# Patient Record
Sex: Male | Born: 1975 | Race: White | Hispanic: No | Marital: Single
Health system: Midwestern US, Academic
[De-identification: ages and names within clinical notes are randomized; demographics above are authoritative.]

## PROBLEM LIST (undated history)

## (undated) DIAGNOSIS — E785 Hyperlipidemia, unspecified: Secondary | ICD-10-CM

## (undated) HISTORY — PX: WISDOM TOOTH EXTRACTION: SHX21

## (undated) HISTORY — DX: Hyperlipidemia, unspecified: E78.5

---

## 2012-05-19 ENCOUNTER — Encounter

## 2012-05-30 ENCOUNTER — Ambulatory Visit

## 2012-06-06 ENCOUNTER — Ambulatory Visit: Admit: 2012-06-06 | Discharge: 2012-06-06 | Payer: PRIVATE HEALTH INSURANCE

## 2012-06-06 ENCOUNTER — Inpatient Hospital Stay: Admit: 2012-06-06 | Payer: PRIVATE HEALTH INSURANCE

## 2012-06-06 DIAGNOSIS — M542 Cervicalgia: Secondary | ICD-10-CM

## 2012-06-06 NOTE — Unmapped (Signed)
App 55mo neck pain/ R trapz pain.  Seen by PMD and got 12d steroids, helped, but then recurred.  Then got steroid shot but didn't help and started w L sided ant lat neck pain  No pain into arms.  Has some tingling in B hands ulnar sided, rare x 40mo.      PE  Gen: No acute distress  HEENT: NCAT  Neck: supple  Chest: symm expansion  Abd: Nondistended  UE: FROM shoulders, elbows, wrists wo pain  LE: FROM hips, knees, ankles wo pain  Spine:  FROM w mild pain at extremes  Sl dominant R trapz  No focal SM deficit    Plan  PT for neck/ trapz strengthening/ strecthing  I had an extensive discussion with the patient regarding the multifactorial nature of axial spinal pain with and without radicular symptoms.  We discussed various treatment options, including nonoperative strategies such as judicious medication use, physical therapy, home exercise programs, and interventional pain management.  Answered pt questions to satisfaction.

## 2013-06-08 ENCOUNTER — Ambulatory Visit: Admit: 2013-06-08 | Discharge: 2013-06-08 | Payer: PRIVATE HEALTH INSURANCE

## 2013-06-08 DIAGNOSIS — M542 Cervicalgia: Secondary | ICD-10-CM

## 2013-06-08 NOTE — Unmapped (Signed)
Has continued neck pain esp in R trapz  Associated w neck ROM  Did PT x 2 sessions, helped last year but then sx recurred in June.  Started HEP but didn't help  Saw PMD, showed RC tear, referred here for further eval    PE  Neg impingement  Pos Spurlings  CO numbness in arm   Mild effort dependent gen weakness esp prox    Imaging revd    Plan  MRI c spine  Fu after mRI

## 2013-06-16 NOTE — Unmapped (Signed)
MRI Cervical Spine WO has been approved through [employer]. Auth# [id number] expires 07/15/13. I called the patient to schedule and per his request, I gave him the number for central scheduling so that he can set up an appropriate time for the scan. -Lauren

## 2013-06-25 ENCOUNTER — Inpatient Hospital Stay: Admit: 2013-06-25 | Payer: PRIVATE HEALTH INSURANCE

## 2013-06-25 DIAGNOSIS — M542 Cervicalgia: Secondary | ICD-10-CM

## 2013-07-06 ENCOUNTER — Ambulatory Visit: Admit: 2013-07-06 | Discharge: 2013-07-06 | Payer: PRIVATE HEALTH INSURANCE

## 2013-07-06 DIAGNOSIS — M542 Cervicalgia: Secondary | ICD-10-CM

## 2013-07-06 NOTE — Unmapped (Signed)
Co R trapz area pain  No radiation down arm  No focal weakness other than rel to activity sec pain    Exam w FORM c spine w mild pain w rot.  No focal SM deficit BUE  Neg winging    MRI c spine nl    Plan  Discussed poss etiologies for sx  Start PT, try TENS, local modalities

## 2013-07-06 NOTE — Unmapped (Signed)
Addended by: Marchelle Gearing L on: 07/06/2013 01:06 PM     Modules accepted: Orders

## 2013-07-06 NOTE — Unmapped (Signed)
Addended by: Raynelle Fanning on: 07/06/2013 01:51 PM     Modules accepted: Orders

## 2013-10-22 ENCOUNTER — Inpatient Hospital Stay: Admit: 2013-10-22 | Payer: PRIVATE HEALTH INSURANCE

## 2013-10-22 ENCOUNTER — Ambulatory Visit: Admit: 2013-10-22 | Discharge: 2013-10-22 | Payer: PRIVATE HEALTH INSURANCE

## 2013-10-22 DIAGNOSIS — M25519 Pain in unspecified shoulder: Secondary | ICD-10-CM

## 2013-10-22 DIAGNOSIS — S40019A Contusion of unspecified shoulder, initial encounter: Secondary | ICD-10-CM
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
## 2013-10-22 NOTE — Unmapped (Signed)
This office note has been dictated.

## 2015-06-08 ENCOUNTER — Encounter: Payer: Self-pay | Admitting: Family Medicine

## 2015-06-08 ENCOUNTER — Ambulatory Visit (INDEPENDENT_AMBULATORY_CARE_PROVIDER_SITE_OTHER): Payer: 59 | Admitting: Family Medicine

## 2015-06-08 VITALS — BP 105/70 | HR 67 | Temp 97.9°F | Ht 70.1 in | Wt 166.0 lb

## 2015-06-08 DIAGNOSIS — Z23 Encounter for immunization: Secondary | ICD-10-CM | POA: Diagnosis not present

## 2015-06-08 DIAGNOSIS — D690 Allergic purpura: Secondary | ICD-10-CM | POA: Diagnosis not present

## 2015-06-08 NOTE — Progress Notes (Signed)
   BP 105/70 mmHg  Pulse 67  Temp(Src) 97.9 F (36.6 C)  Ht 5' 10.1" (1.781 m)  Wt 166 lb (75.297 kg)  BMI 23.74 kg/m2  SpO2 97%   Subjective:    Patient ID: Brian Cox, male    DOB: 12-Jun-1976, 39 y.o.   MRN: [id number]  HPI: Brian Cox is a 39 y.o. male  Chief Complaint  Patient presents with  . 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  Relevant past medical, surgical, family and social history reviewed and updated as indicated. Interim medical history since our last visit reviewed. Allergies and medications reviewed and updated.  Review of Systems  Constitutional: Negative.   HENT: Negative.   Respiratory: Negative.   Cardiovascular: Negative.     Per HPI unless specifically indicated above     Objective:    BP 105/70 mmHg  Pulse 67  Temp(Src) 97.9 F (36.6 C)  Ht 5' 10.1" (1.781 m)  Wt 166 lb (75.297 kg)  BMI 23.74 kg/m2  SpO2 97%  Wt Readings from Last 3 Encounters:  06/08/15 166 lb (75.297 kg)    Physical Exam  Constitutional: He is oriented to person, place, and time. He appears well-developed and well-nourished. No distress.  HENT:  Head: Normocephalic and atraumatic.  Right Ear: Hearing normal.  Left Ear: Hearing normal.  Nose: Nose normal.  Eyes: Conjunctivae and lids are normal. Right eye exhibits no discharge. Left eye exhibits no discharge. No scleral icterus.  Cardiovascular: Normal rate, regular rhythm and normal heart sounds.   Pulmonary/Chest: Effort normal and breath sounds normal. No respiratory distress.  Musculoskeletal: Normal range of motion.  Neurological: He is alert and oriented to person, place, and time.  Skin: Skin is intact. No  rash noted.  Psychiatric: He has a normal mood and affect. His speech is normal and behavior is normal. Judgment and thought content normal. Cognition and memory are normal.    No results found for this or any previous visit.    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

## 2016-03-29 ENCOUNTER — Ambulatory Visit (INDEPENDENT_AMBULATORY_CARE_PROVIDER_SITE_OTHER): Payer: 59 | Admitting: Family Medicine

## 2016-03-29 ENCOUNTER — Encounter: Payer: Self-pay | Admitting: Family Medicine

## 2016-03-29 VITALS — BP 114/72 | HR 79 | Temp 97.8°F | Ht 71.0 in | Wt 165.0 lb

## 2016-03-29 DIAGNOSIS — Z0000 Encounter for general adult medical examination without abnormal findings: Secondary | ICD-10-CM | POA: Diagnosis not present

## 2016-03-29 LAB — URINALYSIS, ROUTINE W REFLEX MICROSCOPIC
BILIRUBIN UA: NEGATIVE
GLUCOSE, UA: NEGATIVE
KETONES UA: NEGATIVE
Leukocytes, UA: NEGATIVE
NITRITE UA: NEGATIVE
Protein, UA: NEGATIVE
SPEC GRAV UA: 1.02 (ref 1.005–1.030)
Urobilinogen, Ur: 0.2 mg/dL (ref 0.2–1.0)
pH, UA: 6 (ref 5.0–7.5)

## 2016-03-29 LAB — MICROSCOPIC EXAMINATION

## 2016-03-29 NOTE — Progress Notes (Signed)
   BP 114/72 (BP Location: Left Arm, Patient Position: Sitting, Cuff Size: Normal)   Pulse 79   Temp 97.8 F (36.6 C)   Ht 5\' 11"  (1.803 m)   Wt 165 lb (74.8 kg)   SpO2 99%   BMI 23.01 kg/m    Subjective:    Patient ID: Brian Cox, male    DOB: 1976/07/16, 40 y.o.   MRN: [id number]  HPI: Brian Cox is a 40 y.o. male  Annual exam Has hx of low vit D takes 5000u a day   Relevant past medical, surgical, family and social history reviewed and updated as indicated. Interim medical history since our last visit reviewed. Allergies and medications reviewed and updated.  Review of Systems  Constitutional: Negative.   HENT: Negative.   Eyes: Negative.   Respiratory: Negative.   Cardiovascular: Negative.   Gastrointestinal: Negative.   Endocrine: Negative.   Genitourinary: Negative.   Musculoskeletal: Negative.   Skin: Negative.   Allergic/Immunologic: Negative.   Neurological: Negative.   Hematological: Negative.   Psychiatric/Behavioral: Negative.     Per HPI unless specifically indicated above     Objective:    BP 114/72 (BP Location: Left Arm, Patient Position: Sitting, Cuff Size: Normal)   Pulse 79   Temp 97.8 F (36.6 C)   Ht 5\' 11"  (1.803 m)   Wt 165 lb (74.8 kg)   SpO2 99%   BMI 23.01 kg/m   Wt Readings from Last 3 Encounters:  03/29/16 165 lb (74.8 kg)  06/08/15 166 lb (75.3 kg)    Physical Exam  Constitutional: He is oriented to person, place, and time. He appears well-developed and well-nourished.  HENT:  Head: Normocephalic.  Right Ear: External ear normal.  Left Ear: External ear normal.  Nose: Nose normal.  Eyes: Conjunctivae and EOM are normal. Pupils are equal, round, and reactive to light.  Neck: Normal range of motion. Neck supple. No thyromegaly present.  Cardiovascular: Normal rate, regular rhythm, normal heart sounds and intact distal pulses.   Pulmonary/Chest: Effort normal and breath sounds normal.  Abdominal: Soft. Bowel  sounds are normal. There is no splenomegaly or hepatomegaly.  Genitourinary: Penis normal.  Musculoskeletal: Normal range of motion.  Lymphadenopathy:    He has no cervical adenopathy.  Neurological: He is alert and oriented to person, place, and time. He has normal reflexes.  Skin: Skin is warm and dry.  Psychiatric: He has a normal mood and affect. His behavior is normal. Judgment and thought content normal.    No results found for this or any previous visit.    Assessment & Plan:   Problem List Items Addressed This Visit    None    Visit Diagnoses    Annual physical exam    -  Primary   Relevant Orders   Urinalysis, Routine w reflex microscopic (not at [facility])   CBC with Differential/Platelet   Comprehensive metabolic panel   Lipid Panel w/o Chol/HDL Ratio   TSH   VITAMIN D 25 Hydroxy (Vit-D Deficiency, Fractures)       Follow up plan: Return in about 1 year (around 03/29/2017) for Physical Exam.

## 2016-03-30 LAB — CBC WITH DIFFERENTIAL/PLATELET
Basophils Absolute: 0.1 10*3/uL (ref 0.0–0.2)
Basos: 1 %
EOS (ABSOLUTE): 0.3 10*3/uL (ref 0.0–0.4)
EOS: 4 %
HEMATOCRIT: 47.4 % (ref 37.5–51.0)
HEMOGLOBIN: 16.3 g/dL (ref 12.6–17.7)
IMMATURE GRANS (ABS): 0 10*3/uL (ref 0.0–0.1)
IMMATURE GRANULOCYTES: 0 %
LYMPHS ABS: 1.7 10*3/uL (ref 0.7–3.1)
Lymphs: 27 %
MCH: 29.6 pg (ref 26.6–33.0)
MCHC: 34.4 g/dL (ref 31.5–35.7)
MCV: 86 fL (ref 79–97)
MONOCYTES: 11 %
Monocytes Absolute: 0.7 10*3/uL (ref 0.1–0.9)
NEUTROS PCT: 57 %
Neutrophils Absolute: 3.6 10*3/uL (ref 1.4–7.0)
Platelets: 226 10*3/uL (ref 150–379)
RBC: 5.51 x10E6/uL (ref 4.14–5.80)
RDW: 12.8 % (ref 12.3–15.4)
WBC: 6.3 10*3/uL (ref 3.4–10.8)

## 2016-03-30 LAB — TSH: TSH: 2.25 u[IU]/mL (ref 0.450–4.500)

## 2016-03-30 LAB — COMPREHENSIVE METABOLIC PANEL
ALBUMIN: 4.8 g/dL (ref 3.5–5.5)
ALT: 20 IU/L (ref 0–44)
AST: 19 IU/L (ref 0–40)
Albumin/Globulin Ratio: 1.7 (ref 1.2–2.2)
Alkaline Phosphatase: 66 IU/L (ref 39–117)
BUN / CREAT RATIO: 10 (ref 9–20)
BUN: 10 mg/dL (ref 6–20)
Bilirubin Total: 0.4 mg/dL (ref 0.0–1.2)
CALCIUM: 9.2 mg/dL (ref 8.7–10.2)
CO2: 23 mmol/L (ref 18–29)
CREATININE: 0.99 mg/dL (ref 0.76–1.27)
Chloride: 100 mmol/L (ref 96–106)
GFR calc Af Amer: 110 mL/min/{1.73_m2} (ref >59)
GFR, EST NON AFRICAN AMERICAN: 96 mL/min/{1.73_m2} (ref >59)
GLOBULIN, TOTAL: 2.8 g/dL (ref 1.5–4.5)
Glucose: 84 mg/dL (ref 65–99)
Potassium: 4 mmol/L (ref 3.5–5.2)
SODIUM: 139 mmol/L (ref 134–144)
TOTAL PROTEIN: 7.6 g/dL (ref 6.0–8.5)

## 2016-03-30 LAB — LIPID PANEL W/O CHOL/HDL RATIO
Cholesterol, Total: 163 mg/dL (ref 100–199)
HDL: 37 mg/dL — ABNORMAL LOW (ref >39)
LDL CALC: 112 mg/dL — AB (ref 0–99)
Triglycerides: 68 mg/dL (ref 0–149)
VLDL CHOLESTEROL CAL: 14 mg/dL (ref 5–40)

## 2016-03-30 LAB — VITAMIN D 25 HYDROXY (VIT D DEFICIENCY, FRACTURES): VIT D 25 HYDROXY: 50 ng/mL (ref 30.0–100.0)

## 2016-04-02 ENCOUNTER — Encounter: Payer: Self-pay | Admitting: Family Medicine

## 2016-09-01 ENCOUNTER — Emergency Department
Admission: EM | Admit: 2016-09-01 | Discharge: 2016-09-01 | Disposition: A | Discharge status: Home / Self Care | Payer: 59 | Attending: Emergency Medicine | Admitting: Emergency Medicine

## 2016-09-01 DIAGNOSIS — K529 Noninfective gastroenteritis and colitis, unspecified: Secondary | ICD-10-CM | POA: Diagnosis not present

## 2016-09-01 DIAGNOSIS — R197 Diarrhea, unspecified: Secondary | ICD-10-CM | POA: Diagnosis present

## 2016-09-01 LAB — COMPREHENSIVE METABOLIC PANEL
ALBUMIN: 4.5 g/dL (ref 3.5–5.0)
ALT: 21 U/L (ref 17–63)
AST: 27 U/L (ref 15–41)
Alkaline Phosphatase: 50 U/L (ref 38–126)
Anion gap: 8 (ref 5–15)
BUN: 17 mg/dL (ref 6–20)
CHLORIDE: 104 mmol/L (ref 101–111)
CO2: 25 mmol/L (ref 22–32)
Calcium: 8.6 mg/dL — ABNORMAL LOW (ref 8.9–10.3)
Creatinine, Ser: 1.05 mg/dL (ref 0.61–1.24)
GFR calc Af Amer: >60 mL/min (ref >60)
GFR calc non Af Amer: >60 mL/min (ref >60)
GLUCOSE: 143 mg/dL — AB (ref 65–99)
POTASSIUM: 3.7 mmol/L (ref 3.5–5.1)
Sodium: 137 mmol/L (ref 135–145)
Total Bilirubin: 0.7 mg/dL (ref 0.3–1.2)
Total Protein: 7.7 g/dL (ref 6.5–8.1)

## 2016-09-01 LAB — CBC
HEMATOCRIT: 49.1 % (ref 40.0–52.0)
Hemoglobin: 17 g/dL (ref 13.0–18.0)
MCH: 29.7 pg (ref 26.0–34.0)
MCHC: 34.7 g/dL (ref 32.0–36.0)
MCV: 85.6 fL (ref 80.0–100.0)
Platelets: 197 10*3/uL (ref 150–440)
RBC: 5.73 MIL/uL (ref 4.40–5.90)
RDW: 13.8 % (ref 11.5–14.5)
WBC: 16.3 10*3/uL — ABNORMAL HIGH (ref 3.8–10.6)

## 2016-09-01 LAB — LIPASE, BLOOD: LIPASE: 20 U/L (ref 11–51)

## 2016-09-01 LAB — URINALYSIS, COMPLETE (UACMP) WITH MICROSCOPIC
BACTERIA UA: NONE SEEN
BILIRUBIN URINE: NEGATIVE
Glucose, UA: NEGATIVE mg/dL
Ketones, ur: 20 mg/dL — AB
LEUKOCYTES UA: NEGATIVE
Nitrite: NEGATIVE
Protein, ur: 30 mg/dL — AB
SPECIFIC GRAVITY, URINE: 1.033 — AB (ref 1.005–1.030)
pH: 5 (ref 5.0–8.0)

## 2016-09-01 MED ORDER — ONDANSETRON 4 MG PO TBDP: 4.0000 mg | ORAL_TABLET | Freq: Three times a day (TID) | ORAL | 0 refills | Status: DC | PRN

## 2016-09-01 MED ORDER — DIPHENHYDRAMINE HCL 25 MG PO CAPS
25.0000 mg | ORAL_CAPSULE | Freq: Once | ORAL | Status: AC
  Administered 2016-09-01: 25 mg via ORAL
  Filled 2016-09-01: qty 1

## 2016-09-01 MED ORDER — ONDANSETRON 4 MG PO TBDP
4.0000 mg | ORAL_TABLET | Freq: Once | ORAL | Status: AC
  Administered 2016-09-01: 4 mg via ORAL
  Filled 2016-09-01: qty 1

## 2016-09-01 MED ORDER — ONDANSETRON 4 MG PO TBDP
4.0000 mg | ORAL_TABLET | Freq: Once | ORAL | Status: AC | PRN
  Administered 2016-09-01: 4 mg via ORAL
  Filled 2016-09-01: qty 1

## 2016-09-01 MED ORDER — METOCLOPRAMIDE HCL 10 MG PO TABS
10.0000 mg | ORAL_TABLET | Freq: Once | ORAL | Status: AC
  Administered 2016-09-01: 10 mg via ORAL
  Filled 2016-09-01: qty 1

## 2016-09-01 NOTE — ED Notes (Signed)
Pt is in good condition; discharge instructions reviewed, follow up care and home care reviewed; prescription medication reviewed; pt verbalized understanding; pt is ambulatory and went home by himself

## 2016-09-01 NOTE — ED Provider Notes (Signed)
[facility] Emergency Department Provider Note        Time seen: ----------------------------------------- 8:04 PM on 09/01/2016 -----------------------------------------    I have reviewed the triage vital signs and the nursing notes.   HISTORY  Chief Complaint Emesis and Diarrhea    HPI Brian Cox is a 41 y.o. male who presents to the ER for nausea, vomiting and diarrhea with abdominal cramping that started yesterday.Patient states he received Zofran in triage here and he's been to drink ice after that. He denies fevers, chills, chest pain, shortness of breath, cough or other complaints at this time. He described cramping in his lower abdomen.   History reviewed. No pertinent past medical history.  There are no active problems to display for this patient.   History reviewed. No pertinent surgical history.  Allergies Ciprofloxacin hcl; Donnatal  [pb-hyoscy-atropine-scopol er]; and Levofloxacin  Social History Social History  Substance Use Topics  . Smoking status: Never Smoker  . Smokeless tobacco: Never Used  . Alcohol use No    Review of Systems Constitutional: Negative for fever. Cardiovascular: Negative for chest pain. Respiratory: Negative for shortness of breath. Gastrointestinal: Positive for abdominal pain, vomiting and diarrhea Genitourinary: Negative for dysuria. Musculoskeletal: Negative for back pain. Skin: Negative for rash. Neurological: Negative for headaches, focal weakness or numbness.  10-point ROS otherwise negative.  ____________________________________________   PHYSICAL EXAM:  VITAL SIGNS: ED Triage Vitals [09/01/16 1703]  Enc Vitals Group     BP 106/71     Pulse Rate (!) 103     Resp 18     Temp 98.1 F (36.7 C)     Temp Source Oral     SpO2 100 %     Weight 165 lb (74.8 kg)     Height 5\' 11"  (1.803 m)     Head Circumference      Peak Flow      Pain Score 6     Pain Loc      Pain Edu?     Excl. in [place]?     Constitutional: Alert and oriented. Well appearing and in no distress. Eyes: Conjunctivae are normal. PERRL. Normal extraocular movements. ENT   Head: Normocephalic and atraumatic.   Nose: No congestion/rhinnorhea.   Mouth/Throat: Mucous membranes are moist.   Neck: No stridor. Cardiovascular: Normal rate, regular rhythm. No murmurs, rubs, or gallops. Respiratory: Normal respiratory effort without tachypnea nor retractions. Breath sounds are clear and equal bilaterally. No wheezes/rales/rhonchi. Gastrointestinal: Soft and nontender. Normal bowel sounds Musculoskeletal: Nontender with normal range of motion in all extremities. No lower extremity tenderness nor edema. Neurologic:  Normal speech and language. No gross focal neurologic deficits are appreciated.  Skin:  Skin is warm, dry and intact. No rash noted. Psychiatric: Mood and affect are normal. Speech and behavior are normal.  ____________________________________________  ED COURSE:  Pertinent labs & imaging results that were available during my care of the patient were reviewed by me and considered in my medical decision making (see chart for details). Clinical Course   Patient is no distress, we will assess with labs and possible imaging.  Procedures ____________________________________________   LABS (pertinent positives/negatives)  Labs Reviewed  COMPREHENSIVE METABOLIC PANEL - Abnormal; Notable for the following:       Result Value   Glucose, Bld 143 (*)    Calcium 8.6 (*)    All other components within normal limits  CBC - Abnormal; Notable for the following:    WBC 16.3 (*)  All other components within normal limits  LIPASE, BLOOD  URINALYSIS, COMPLETE (UACMP) WITH MICROSCOPIC   ____________________________________________  FINAL ASSESSMENT AND PLAN  Gastroenteritis  Plan: Patient with labs as dictated above. Patient's no distress, currently his symptoms have improved.  Leukocytosis is likely reactive to this viral illness. He has no focal abdominal tenderness. He will be discharge with Zofran. He is stable for outpatient follow-up.   Earleen Newport, MD   Note: This dictation was prepared with Dragon dictation. Any transcriptional errors that result from this process are unintentional    Earleen Newport, MD 09/01/16 2012

## 2016-09-01 NOTE — ED Notes (Signed)
Pt back up to the desk asking for nausea medication at this time. Given ordered zofran that pt had refused prior.

## 2016-09-01 NOTE — ED Triage Notes (Signed)
Pt arrives to ER via POV c/o NVD and abdominal cramping that started yesterday. Pt alert and oriented X4, active, cooperative, pt in NAD. RR even and unlabored, color WNL.

## 2016-11-30 DIAGNOSIS — H1013 Acute atopic conjunctivitis, bilateral: Secondary | ICD-10-CM | POA: Diagnosis not present

## 2017-03-23 DIAGNOSIS — J988 Other specified respiratory disorders: Secondary | ICD-10-CM | POA: Diagnosis not present

## 2017-03-25 ENCOUNTER — Encounter: Payer: Self-pay | Admitting: Family Medicine

## 2017-03-25 ENCOUNTER — Ambulatory Visit (INDEPENDENT_AMBULATORY_CARE_PROVIDER_SITE_OTHER): Payer: 59 | Admitting: Family Medicine

## 2017-03-25 VITALS — BP 119/82 | HR 76 | Temp 98.5°F | Wt 167.0 lb

## 2017-03-25 DIAGNOSIS — J020 Streptococcal pharyngitis: Secondary | ICD-10-CM | POA: Diagnosis not present

## 2017-03-25 DIAGNOSIS — J029 Acute pharyngitis, unspecified: Secondary | ICD-10-CM | POA: Diagnosis not present

## 2017-03-25 MED ORDER — LIDOCAINE VISCOUS 2 % MT SOLN: 5.0000 mL | OROMUCOSAL | 0 refills | Status: DC | PRN

## 2017-03-25 MED ORDER — AMOXICILLIN-POT CLAVULANATE 875-125 MG PO TABS: 1.0000 | ORAL_TABLET | Freq: Two times a day (BID) | ORAL | 0 refills | Status: DC
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
## 2017-03-25 NOTE — Patient Instructions (Signed)
Follow up if no improvement 

## 2017-03-26 LAB — RAPID STREP SCREEN (MED CTR MEBANE ONLY): STREP GP A AG, IA W/REFLEX: POSITIVE — AB

## 2017-04-01 ENCOUNTER — Ambulatory Visit (INDEPENDENT_AMBULATORY_CARE_PROVIDER_SITE_OTHER): Payer: 59 | Admitting: Family Medicine

## 2017-04-01 ENCOUNTER — Encounter: Payer: Self-pay | Admitting: Family Medicine

## 2017-04-01 VITALS — BP 127/77 | HR 51 | Ht 71.85 in | Wt 166.9 lb

## 2017-04-01 DIAGNOSIS — Z0000 Encounter for general adult medical examination without abnormal findings: Secondary | ICD-10-CM | POA: Diagnosis not present

## 2017-04-01 DIAGNOSIS — Z131 Encounter for screening for diabetes mellitus: Secondary | ICD-10-CM

## 2017-04-01 DIAGNOSIS — Z125 Encounter for screening for malignant neoplasm of prostate: Secondary | ICD-10-CM

## 2017-04-01 DIAGNOSIS — Z13220 Encounter for screening for lipoid disorders: Secondary | ICD-10-CM

## 2017-04-01 DIAGNOSIS — Z1329 Encounter for screening for other suspected endocrine disorder: Secondary | ICD-10-CM | POA: Diagnosis not present

## 2017-04-01 LAB — URINALYSIS, ROUTINE W REFLEX MICROSCOPIC
Bilirubin, UA: NEGATIVE
GLUCOSE, UA: NEGATIVE
KETONES UA: NEGATIVE
Leukocytes, UA: NEGATIVE
NITRITE UA: NEGATIVE
Protein, UA: NEGATIVE
Specific Gravity, UA: 1.02 (ref 1.005–1.030)
UUROB: 0.2 mg/dL (ref 0.2–1.0)
pH, UA: 6 (ref 5.0–7.5)

## 2017-04-01 LAB — MICROSCOPIC EXAMINATION
Bacteria, UA: NONE SEEN
Epithelial Cells (non renal): NONE SEEN /hpf (ref 0–10)
WBC UA: NONE SEEN /HPF (ref 0–?)

## 2017-04-01 NOTE — Progress Notes (Signed)
   BP 127/77   Pulse (!) 51   Ht 5' 11.85" (1.825 m)   Wt 166 lb 14.4 oz (75.7 kg)   SpO2 98%   BMI 22.73 kg/m    Subjective:    Patient ID: Brian Cox, male    DOB: September 16, 1975, 41 y.o.   MRN: [id number]  HPI: Brian Cox is a 41 y.o. male  Physical  Patient's sore throats feeling better A little bit but is on the mend. Otherwise doing well with no other concerns  Relevant past medical, surgical, family and social history reviewed and updated as indicated. Interim medical history since our last visit reviewed. Allergies and medications reviewed and updated.  Review of Systems  Constitutional: Negative.   HENT: Negative.   Eyes: Negative.   Respiratory: Negative.   Cardiovascular: Negative.   Gastrointestinal: Negative.   Endocrine: Negative.   Genitourinary: Negative.   Musculoskeletal: Negative.   Skin: Negative.   Allergic/Immunologic: Negative.   Neurological: Negative.   Hematological: Negative.   Psychiatric/Behavioral: Negative.     Per HPI unless specifically indicated above     Objective:    BP 127/77   Pulse (!) 51   Ht 5' 11.85" (1.825 m)   Wt 166 lb 14.4 oz (75.7 kg)   SpO2 98%   BMI 22.73 kg/m   Wt Readings from Last 3 Encounters:  04/01/17 166 lb 14.4 oz (75.7 kg)  03/25/17 167 lb (75.8 kg)  09/01/16 165 lb (74.8 kg)    Physical Exam  Constitutional: He is oriented to person, place, and time. He appears well-developed and well-nourished.  HENT:  Head: Normocephalic and atraumatic.  Right Ear: External ear normal.  Left Ear: External ear normal.  Eyes: Pupils are equal, round, and reactive to light. Conjunctivae and EOM are normal.  Neck: Normal range of motion. Neck supple.  Cardiovascular: Normal rate, regular rhythm, normal heart sounds and intact distal pulses.   Pulmonary/Chest: Effort normal and breath sounds normal.  Abdominal: Soft. Bowel sounds are normal. There is no splenomegaly or hepatomegaly.  Genitourinary:  Rectum normal, prostate normal and penis normal.  Musculoskeletal: Normal range of motion.  Neurological: He is alert and oriented to person, place, and time. He has normal reflexes.  Skin: No rash noted. No erythema.  Psychiatric: He has a normal mood and affect. His behavior is normal. Judgment and thought content normal.    Results for orders placed or performed in visit on 03/25/17  Rapid strep screen (not at [facility])  Result Value Ref Range   Strep Gp A Ag, IA W/Reflex Positive (A) Negative      Assessment & Plan:   Problem List Items Addressed This Visit    None    Visit Diagnoses    Screening for diabetes mellitus (DM)    -  Primary   Relevant Orders   Comprehensive metabolic panel   Urinalysis, Routine w reflex microscopic   Annual physical exam       Relevant Orders   CBC with Differential/Platelet   Screening cholesterol level       Relevant Orders   Lipid panel   Prostate cancer screening       Thyroid disorder screen       Relevant Orders   TSH       Follow up plan: Return in about 1 year (around 04/01/2018).

## 2017-04-02 ENCOUNTER — Encounter: Payer: Self-pay | Admitting: Family Medicine

## 2017-04-02 ENCOUNTER — Telehealth: Payer: Self-pay | Admitting: Family Medicine

## 2017-04-02 DIAGNOSIS — R748 Abnormal levels of other serum enzymes: Secondary | ICD-10-CM

## 2017-04-02 DIAGNOSIS — E7800 Pure hypercholesterolemia, unspecified: Secondary | ICD-10-CM

## 2017-04-02 LAB — COMPREHENSIVE METABOLIC PANEL
ALK PHOS: 82 IU/L (ref 39–117)
ALT: 50 IU/L — AB (ref 0–44)
AST: 30 IU/L (ref 0–40)
Albumin/Globulin Ratio: 1.3 (ref 1.2–2.2)
Albumin: 4.3 g/dL (ref 3.5–5.5)
BUN/Creatinine Ratio: 11 (ref 9–20)
BUN: 11 mg/dL (ref 6–24)
Bilirubin Total: 0.2 mg/dL (ref 0.0–1.2)
CO2: 24 mmol/L (ref 20–29)
Calcium: 9.3 mg/dL (ref 8.7–10.2)
Chloride: 104 mmol/L (ref 96–106)
Creatinine, Ser: 0.98 mg/dL (ref 0.76–1.27)
GFR calc Af Amer: 111 mL/min/{1.73_m2} (ref >59)
GFR calc non Af Amer: 96 mL/min/{1.73_m2} (ref >59)
GLUCOSE: 91 mg/dL (ref 65–99)
Globulin, Total: 3.2 g/dL (ref 1.5–4.5)
POTASSIUM: 5 mmol/L (ref 3.5–5.2)
Sodium: 139 mmol/L (ref 134–144)
TOTAL PROTEIN: 7.5 g/dL (ref 6.0–8.5)

## 2017-04-02 LAB — LIPID PANEL
CHOL/HDL RATIO: 5.2 ratio — AB (ref 0.0–5.0)
Cholesterol, Total: 177 mg/dL (ref 100–199)
HDL: 34 mg/dL — AB (ref >39)
LDL Calculated: 130 mg/dL — ABNORMAL HIGH (ref 0–99)
Triglycerides: 63 mg/dL (ref 0–149)
VLDL Cholesterol Cal: 13 mg/dL (ref 5–40)

## 2017-04-02 LAB — CBC WITH DIFFERENTIAL/PLATELET
BASOS ABS: 0.1 10*3/uL (ref 0.0–0.2)
Basos: 1 %
EOS (ABSOLUTE): 0.2 10*3/uL (ref 0.0–0.4)
Eos: 3 %
Hematocrit: 46.8 % (ref 37.5–51.0)
Hemoglobin: 16.1 g/dL (ref 13.0–17.7)
IMMATURE GRANS (ABS): 0.1 10*3/uL (ref 0.0–0.1)
Immature Granulocytes: 1 %
LYMPHS: 28 %
Lymphocytes Absolute: 1.8 10*3/uL (ref 0.7–3.1)
MCH: 29.5 pg (ref 26.6–33.0)
MCHC: 34.4 g/dL (ref 31.5–35.7)
MCV: 86 fL (ref 79–97)
Monocytes Absolute: 0.6 10*3/uL (ref 0.1–0.9)
Monocytes: 9 %
Neutrophils Absolute: 3.7 10*3/uL (ref 1.4–7.0)
Neutrophils: 58 %
PLATELETS: 295 10*3/uL (ref 150–379)
RBC: 5.46 x10E6/uL (ref 4.14–5.80)
RDW: 13.2 % (ref 12.3–15.4)
WBC: 6.4 10*3/uL (ref 3.4–10.8)

## 2017-04-02 LAB — TSH: TSH: 1.64 u[IU]/mL (ref 0.450–4.500)

## 2017-04-02 NOTE — Telephone Encounter (Signed)
Phone call Discussed with patient slight elevation in liver and cholesterol patient just got off antibiotics will recheck lipid panel ALT AST 1 month or so lab only

## 2017-06-06 DIAGNOSIS — Z23 Encounter for immunization: Secondary | ICD-10-CM | POA: Diagnosis not present

## 2017-08-20 DIAGNOSIS — N410 Acute prostatitis: Secondary | ICD-10-CM

## 2017-08-20 HISTORY — DX: Acute prostatitis: N41.0

## 2017-09-25 ENCOUNTER — Ambulatory Visit: Payer: 59 | Admitting: Family Medicine

## 2017-09-25 ENCOUNTER — Encounter: Payer: Self-pay | Admitting: Family Medicine

## 2017-09-25 DIAGNOSIS — D170 Benign lipomatous neoplasm of skin and subcutaneous tissue of head, face and neck: Secondary | ICD-10-CM | POA: Diagnosis not present

## 2017-09-25 DIAGNOSIS — E785 Hyperlipidemia, unspecified: Secondary | ICD-10-CM | POA: Insufficient documentation

## 2017-09-25 DIAGNOSIS — R748 Abnormal levels of other serum enzymes: Secondary | ICD-10-CM

## 2017-09-25 DIAGNOSIS — E7800 Pure hypercholesterolemia, unspecified: Secondary | ICD-10-CM

## 2017-09-25 NOTE — Assessment & Plan Note (Signed)
Observe changes or becomes problematic will need for for further excision

## 2017-09-25 NOTE — Assessment & Plan Note (Signed)
Labs pending apparent diet control

## 2017-09-25 NOTE — Progress Notes (Signed)
BP 134/84 (BP Location: Right Arm)   Pulse 77   Wt 165 lb (74.8 kg)   SpO2 99%   BMI 22.47 kg/m    Subjective:    Patient ID: Brian Cox, male    DOB: February 15, 1976, 42 y.o.   MRN: [id number]  HPI: Brian Cox is a 42 y.o. male  Chief Complaint  Patient presents with  . Follow-up  . Mass   Patient follow-up cholesterol tried to do better from cholesterol 6 months ago.  Also is fasting this morning not taking any medications signs or symptoms of too much cholesterol. Patient's noticed the last several months a small lump at the hairline left side of base of his neck approximately 2 cm soft movable nonpainful no real change in size. Relevant past medical, surgical, family and social history reviewed and updated as indicated. Interim medical history since our last visit reviewed. Allergies and medications reviewed and updated.  Review of Systems  Constitutional: Negative.   Respiratory: Negative.   Cardiovascular: Negative.     Per HPI unless specifically indicated above     Objective:    BP 134/84 (BP Location: Right Arm)   Pulse 77   Wt 165 lb (74.8 kg)   SpO2 99%   BMI 22.47 kg/m   Wt Readings from Last 3 Encounters:  09/25/17 165 lb (74.8 kg)  04/01/17 166 lb 14.4 oz (75.7 kg)  03/25/17 167 lb (75.8 kg)    Physical Exam  Constitutional: He is oriented to person, place, and time. He appears well-developed and well-nourished.  HENT:  Head: Normocephalic and atraumatic.  Eyes: Conjunctivae and EOM are normal.  Neck: Normal range of motion.  Cardiovascular: Normal rate, regular rhythm and normal heart sounds.  Pulmonary/Chest: Effort normal and breath sounds normal.  Musculoskeletal: Normal range of motion.  Neurological: He is alert and oriented to person, place, and time.  Skin: No erythema.  Small soft easily movable 2 cm lump left neck hairline.  Psychiatric: He has a normal mood and affect. His behavior is normal. Judgment and thought content  normal.    Results for orders placed or performed in visit on 04/01/17  Microscopic Examination  Result Value Ref Range   WBC, UA None seen 0 - 5 /hpf   RBC, UA 0-2 0 - 2 /hpf   Epithelial Cells (non renal) None seen 0 - 10 /hpf   Bacteria, UA None seen None seen/Few  CBC with Differential/Platelet  Result Value Ref Range   WBC 6.4 3.4 - 10.8 x10E3/uL   RBC 5.46 4.14 - 5.80 x10E6/uL   Hemoglobin 16.1 13.0 - 17.7 g/dL   Hematocrit 46.8 37.5 - 51.0 %   MCV 86 79 - 97 fL   MCH 29.5 26.6 - 33.0 pg   MCHC 34.4 31.5 - 35.7 g/dL   RDW 13.2 12.3 - 15.4 %   Platelets 295 150 - 379 x10E3/uL   Neutrophils 58 Not Estab. %   Lymphs 28 Not Estab. %   Monocytes 9 Not Estab. %   Eos 3 Not Estab. %   Basos 1 Not Estab. %   Neutrophils Absolute 3.7 1.4 - 7.0 x10E3/uL   Lymphocytes Absolute 1.8 0.7 - 3.1 x10E3/uL   Monocytes Absolute 0.6 0.1 - 0.9 x10E3/uL   EOS (ABSOLUTE) 0.2 0.0 - 0.4 x10E3/uL   Basophils Absolute 0.1 0.0 - 0.2 x10E3/uL   Immature Granulocytes 1 Not Estab. %   Immature Grans (Abs) 0.1 0.0 - 0.1 x10E3/uL  Comprehensive metabolic panel  Result Value Ref Range   Glucose 91 65 - 99 mg/dL   BUN 11 6 - 24 mg/dL   Creatinine, Ser 0.98 0.76 - 1.27 mg/dL   GFR calc non Af Amer 96 >59 mL/min/1.73   GFR calc Af Amer 111 >59 mL/min/1.73   BUN/Creatinine Ratio 11 9 - 20   Sodium 139 134 - 144 mmol/L   Potassium 5.0 3.5 - 5.2 mmol/L   Chloride 104 96 - 106 mmol/L   CO2 24 20 - 29 mmol/L   Calcium 9.3 8.7 - 10.2 mg/dL   Total Protein 7.5 6.0 - 8.5 g/dL   Albumin 4.3 3.5 - 5.5 g/dL   Globulin, Total 3.2 1.5 - 4.5 g/dL   Albumin/Globulin Ratio 1.3 1.2 - 2.2   Bilirubin Total 0.2 0.0 - 1.2 mg/dL   Alkaline Phosphatase 82 39 - 117 IU/L   AST 30 0 - 40 IU/L   ALT 50 (H) 0 - 44 IU/L  Lipid panel  Result Value Ref Range   Cholesterol, Total 177 100 - 199 mg/dL   Triglycerides 63 0 - 149 mg/dL   HDL 34 (L) >39 mg/dL   VLDL Cholesterol Cal 13 5 - 40 mg/dL   LDL Calculated 130 (H)  0 - 99 mg/dL   Chol/HDL Ratio 5.2 (H) 0.0 - 5.0 ratio  Urinalysis, Routine w reflex microscopic  Result Value Ref Range   Specific Gravity, UA 1.020 1.005 - 1.030   pH, UA 6.0 5.0 - 7.5   Color, UA Yellow Yellow   Appearance Ur Clear Clear   Leukocytes, UA Negative Negative   Protein, UA Negative Negative/Trace   Glucose, UA Negative Negative   Ketones, UA Negative Negative   RBC, UA 1+ (A) Negative   Bilirubin, UA Negative Negative   Urobilinogen, Ur 0.2 0.2 - 1.0 mg/dL   Nitrite, UA Negative Negative   Microscopic Examination See below:   TSH  Result Value Ref Range   TSH 1.640 0.450 - 4.500 uIU/mL      Assessment & Plan:   Problem List Items Addressed This Visit      Musculoskeletal and Integument   Lipoma of neck    Observe changes or becomes problematic will need for for further excision        Other   Hypercholesterolemia    Labs pending apparent diet control       Other Visit Diagnoses    Elevated cholesterol       Elevated liver enzymes           Follow up plan: Return in about 6 months (around 03/25/2018) for Physical Exam.

## 2017-09-26 ENCOUNTER — Encounter: Payer: Self-pay | Admitting: Family Medicine

## 2017-09-26 LAB — AST: AST: 24 IU/L (ref 0–40)

## 2017-09-26 LAB — LIPID PANEL
Chol/HDL Ratio: 4.3 ratio (ref 0.0–5.0)
Cholesterol, Total: 162 mg/dL (ref 100–199)
HDL: 38 mg/dL — ABNORMAL LOW (ref >39)
LDL Calculated: 114 mg/dL — ABNORMAL HIGH (ref 0–99)
Triglycerides: 49 mg/dL (ref 0–149)
VLDL Cholesterol Cal: 10 mg/dL (ref 5–40)

## 2017-09-26 LAB — ALT: ALT: 31 IU/L (ref 0–44)

## 2018-01-10 DIAGNOSIS — R319 Hematuria, unspecified: Secondary | ICD-10-CM | POA: Diagnosis not present

## 2018-01-10 DIAGNOSIS — R300 Dysuria: Secondary | ICD-10-CM | POA: Diagnosis not present

## 2018-01-14 ENCOUNTER — Encounter: Payer: Self-pay | Admitting: Family Medicine

## 2018-01-14 NOTE — Progress Notes (Signed)
BP 112/68 (BP Location: Left Arm, Patient Position: Sitting, Cuff Size: Normal)   Pulse 63   Temp 98 F (36.7 C) (Tympanic)   Ht 5\' 11"  (1.803 m)   Wt 167 lb 6.4 oz (75.9 kg)   SpO2 100%   BMI 23.35 kg/m    Subjective:    Patient ID: Brian Cox, male    DOB: January 30, 1976, 42 y.o.   MRN: [id number]  HPI: Brian Cox is a 42 y.o. male  Chief Complaint  Patient presents with  . Dysuria  . Penile Discharge   URINARY SYMPTOMS Duration: about a week Dysuria: burning  Leakage: yes Urinary frequency: yes Urgency: yes Small volume voids: yes Symptom severity: moderate Urinary incontinence: no Foul odor: no Hematuria: no Abdominal pain: no Back pain: no Suprapubic pain/pressure: yes Flank pain: no Fever:  yes, no, subjective and low grade Vomiting: no Relief with cranberry juice: no Relief with pyridium: no Status: worse Previous urinary tract infection: no Recurrent urinary tract infection: no Sexual activity: No sexually active History of sexually transmitted disease: no Penile discharge: no Treatments attempted: none   Has been constipated for the last week- has only had about 2 BMs in the last week. Otherwise feeling well.    Relevant past medical, surgical, family and social history reviewed and updated as indicated. Interim medical history since our last visit reviewed. Allergies and medications reviewed and updated.  Review of Systems  Constitutional: Negative.   Respiratory: Negative.   Cardiovascular: Negative.   Gastrointestinal: Positive for constipation. Negative for abdominal distention, abdominal pain, anal bleeding, blood in stool, diarrhea, nausea, rectal pain and vomiting.  Genitourinary: Positive for dysuria, frequency and urgency. Negative for decreased urine volume, difficulty urinating, discharge, enuresis, flank pain, genital sores, hematuria, penile pain, penile swelling, scrotal swelling and testicular pain.  Psychiatric/Behavioral:  Negative.     Per HPI unless specifically indicated above     Objective:    BP 112/68 (BP Location: Left Arm, Patient Position: Sitting, Cuff Size: Normal)   Pulse 63   Temp 98 F (36.7 C) (Tympanic)   Ht 5\' 11"  (1.803 m)   Wt 167 lb 6.4 oz (75.9 kg)   SpO2 100%   BMI 23.35 kg/m   Wt Readings from Last 3 Encounters:  01/15/18 167 lb 6.4 oz (75.9 kg)  09/25/17 165 lb (74.8 kg)  04/01/17 166 lb 14.4 oz (75.7 kg)    Physical Exam  Constitutional: He is oriented to person, place, and time. He appears well-developed and well-nourished. No distress.  HENT:  Head: Normocephalic and atraumatic.  Right Ear: Hearing normal.  Left Ear: Hearing normal.  Nose: Nose normal.  Eyes: Conjunctivae and lids are normal. Right eye exhibits no discharge. Left eye exhibits no discharge. No scleral icterus.  Cardiovascular: Normal rate, regular rhythm, normal heart sounds and intact distal pulses. Exam reveals no gallop and no friction rub.  No murmur heard. Pulmonary/Chest: Effort normal and breath sounds normal. No stridor. No respiratory distress. He has no wheezes. He has no rales. He exhibits no tenderness.  Abdominal: Soft. Bowel sounds are normal. He exhibits no distension and no mass. There is no tenderness. There is no rebound and no guarding. No hernia.  Musculoskeletal: Normal range of motion.  Neurological: He is alert and oriented to person, place, and time.  Skin: Skin is warm, dry and intact. Capillary refill takes less than 2 seconds. No rash noted. He is not diaphoretic. No erythema. No pallor.  Psychiatric: He has a normal  mood and affect. His speech is normal and behavior is normal. Judgment and thought content normal. Cognition and memory are normal.  Nursing note and vitals reviewed.   Results for orders placed or performed in visit on 01/15/18  GC/Chlamydia Probe Amp  Result Value Ref Range   Chlamydia trachomatis, NAA Negative Negative   Neisseria gonorrhoeae by PCR  Negative Negative  Urine Culture  Result Value Ref Range   Urine Culture, Routine Final report    Organism ID, Bacteria No growth   Microscopic Examination  Result Value Ref Range   WBC, UA 0-5 0 - 5 /hpf   RBC, UA 0-2 0 - 2 /hpf   Epithelial Cells (non renal) 0-10 0 - 10 /hpf   Bacteria, UA None seen None seen/Few  UA/M w/rflx Culture, Routine  Result Value Ref Range   Specific Gravity, UA 1.010 1.005 - 1.030   pH, UA 6.5 5.0 - 7.5   Color, UA Straw Yellow   Appearance Ur Clear Clear   Leukocytes, UA Negative Negative   Protein, UA Negative Negative/Trace   Glucose, UA Negative Negative   Ketones, UA Negative Negative   RBC, UA Trace (A) Negative   Bilirubin, UA Negative Negative   Urobilinogen, Ur 0.2 0.2 - 1.0 mg/dL   Nitrite, UA Negative Negative   Microscopic Examination See below:   HIV antibody  Result Value Ref Range   HIV Screen 4th Generation wRfx Non Reactive Non Reactive  RPR  Result Value Ref Range   RPR Ser Ql Non Reactive Non Reactive  Hepatitis, Acute  Result Value Ref Range   Hep A IgM Negative Negative   Hepatitis B Surface Ag Negative Negative   Hep B C IgM Negative Negative   Hep C Virus Ab <0.1 0.0 - 0.9 s/co ratio  HSV(herpes simplex vrs) 1+2 ab-IgG  Result Value Ref Range   HSV 1 Glycoprotein G Ab, IgG <0.91 0.00 - 0.90 index   HSV 2 IgG, Type Spec <0.91 0.00 - 0.90 index      Assessment & Plan:   Problem List Items Addressed This Visit    None    Visit Diagnoses    Acute prostatitis    -  Primary   Will treat with bactrim. Call with any concerns.    Dysuria       UA clear. wIll treat for prostatitis. Call with any concerns.    Relevant Orders   UA/M w/rflx Culture, Routine (Completed)   Urine Culture (Completed)   Routine screening for STI (sexually transmitted infection)       Labs drawn today. Await results.    Relevant Orders   HIV antibody (Completed)   GC/Chlamydia Probe Amp (Completed)   RPR (Completed)   Hepatitis, Acute  (Completed)   HSV(herpes simplex vrs) 1+2 ab-IgG (Completed)       Follow up plan: Return if symptoms worsen or fail to improve.

## 2018-01-15 ENCOUNTER — Ambulatory Visit: Payer: 59 | Admitting: Family Medicine

## 2018-01-15 ENCOUNTER — Encounter: Payer: Self-pay | Admitting: Family Medicine

## 2018-01-15 ENCOUNTER — Other Ambulatory Visit: Payer: Self-pay

## 2018-01-15 VITALS — BP 112/68 | HR 63 | Temp 98.0°F | Ht 71.0 in | Wt 167.4 lb

## 2018-01-15 DIAGNOSIS — N410 Acute prostatitis: Secondary | ICD-10-CM

## 2018-01-15 DIAGNOSIS — R300 Dysuria: Secondary | ICD-10-CM

## 2018-01-15 DIAGNOSIS — Z113 Encounter for screening for infections with a predominantly sexual mode of transmission: Secondary | ICD-10-CM

## 2018-01-15 LAB — MICROSCOPIC EXAMINATION: Bacteria, UA: NONE SEEN

## 2018-01-15 LAB — UA/M W/RFLX CULTURE, ROUTINE
BILIRUBIN UA: NEGATIVE
Glucose, UA: NEGATIVE
Ketones, UA: NEGATIVE
Leukocytes, UA: NEGATIVE
NITRITE UA: NEGATIVE
PROTEIN UA: NEGATIVE
Specific Gravity, UA: 1.01 (ref 1.005–1.030)
UUROB: 0.2 mg/dL (ref 0.2–1.0)
pH, UA: 6.5 (ref 5.0–7.5)

## 2018-01-15 MED ORDER — SULFAMETHOXAZOLE-TRIMETHOPRIM 800-160 MG PO TABS: 1.0000 | ORAL_TABLET | Freq: Two times a day (BID) | ORAL | 0 refills | Status: DC

## 2018-01-15 MED ORDER — POLYETHYLENE GLYCOL 3350 17 GM/SCOOP PO POWD: 17.0000 g | Freq: Two times a day (BID) | ORAL | 1 refills | Status: AC | PRN

## 2018-01-15 NOTE — Patient Instructions (Signed)
Prostatitis Prostatitis is swelling or inflammation of the prostate gland. The prostate is a walnut-sized gland that is involved in the production of semen. It is located below a man's bladder, in front of the rectum. There are four types of prostatitis:  Chronic nonbacterial prostatitis. This is the most common type of prostatitis. It may be associated with a viral infection or autoimmune disorder.  Acute bacterial prostatitis. This is the least common type of prostatitis. It starts quickly and is usually associated with a bladder infection, high fever, and shaking chills. It can occur at any age.  Chronic bacterial prostatitis. This type usually results from acute bacterial prostatitis that happens repeatedly (is recurrent) or has not been treated properly. It can occur in men of any age but is most common among middle-aged men whose prostate has begun to get larger. The symptoms are not as severe as symptoms caused by acute bacterial prostatitis.  Prostatodynia or chronic pelvic pain syndrome (CPPS). This type is also called pelvic floor disorder. It is associated with increased muscular tone in the pelvis surrounding the prostate.  What are the causes? Bacterial prostatitis is caused by infection from bacteria. Chronic nonbacterial prostatitis may be caused by:  Urinary tract infections (UTIs).  Nerve damage.  A response by the body's disease-fighting system (autoimmune response).  Chemicals in the urine.  The causes of the other types of prostatitis are usually not known. What are the signs or symptoms? Symptoms of this condition vary depending upon the type of prostatitis. If you have acute bacterial prostatitis, you may experience:  Urinary symptoms, such as: ? Painful urination. ? Burning during urination. ? Frequent and sudden urges to urinate. ? Inability to start urinating. ? A weak or interrupted stream of urine.  Vomiting.  Nausea.  Fever.  Chills.  Inability to  empty the bladder completely.  Pain in the: ? Muscles or joints. ? Lower back. ? Lower abdomen.  If you have any of the other types of prostatitis, you may experience:  Urinary symptoms, such as: ? Sudden urges to urinate. ? Frequent urination. ? Difficulty starting urination. ? Weak urine stream. ? Dribbling after urination.  Discharge from the urethra. The urethra is a tube that opens at the end of the penis.  Pain in the: ? Testicles. ? Penis or tip of the penis. ? Rectum. ? Area in front of the rectum and below the scrotum (perineum).  Problems with sexual function.  Painful ejaculation.  Bloody semen.  How is this diagnosed? This condition may be diagnosed based on:  A physical and medical exam.  Your symptoms.  A urine test to check for bacteria.  An exam in which a health care provider uses a finger to feel the prostate (digital rectal exam).  A test of a sample of semen.  Blood tests.  Ultrasound.  Removal of prostate tissue to be examined under a microscope (biopsy).  Tests to check how your body handles urine (urodynamic tests).  A test to look inside your bladder or urethra (cystoscopy).  How is this treated? Treatment for this condition depends on the type of prostatitis. Treatment may involve:  Medicines to relieve pain or inflammation.  Medicines to help relax your muscles.  Physical therapy.  Heat therapy.  Techniques to help you control certain body functions (biofeedback).  Relaxation exercises.  Antibiotic medicine, if your condition is caused by bacteria.  Warm water baths (sitz baths). Sitz baths help with relaxing your pelvic floor muscles, which helps to relieve  pressure on the prostate.  Follow these instructions at home:  Take over-the-counter and prescription medicines only as told by your health care provider.  If you were prescribed an antibiotic, take it as told by your health care provider. Do not stop taking the  antibiotic even if you start to feel better.  If physical therapy, biofeedback, or relaxation exercises were prescribed, do exercises as instructed.  Take sitz baths as directed by your health care provider. For a sitz bath, sit in warm water that is deep enough to cover your hips and buttocks.  Keep all follow-up visits as told by your health care provider. This is important. Contact a health care provider if:  Your symptoms get worse.  You have a fever. Get help right away if:  You have chills.  You feel nauseous.  You vomit.  You feel light-headed or feel like you are going to faint.  You are unable to urinate.  You have blood or blood clots in your urine. This information is not intended to replace advice given to you by your health care provider. Make sure you discuss any questions you have with your health care provider. Document Released: 08/03/2000 Document Revised: 04/26/2016 Document Reviewed: 04/26/2016 Elsevier Interactive Patient Education  2017 [employer].  Constipation, Adult Constipation is when a person has fewer bowel movements in a week than normal, has difficulty having a bowel movement, or has stools that are dry, hard, or larger than normal. Constipation may be caused by an underlying condition. It may become worse with age if a person takes certain medicines and does not take in enough fluids. Follow these instructions at home: Eating and drinking   Eat foods that have a lot of fiber, such as fresh fruits and vegetables, whole grains, and beans.  Limit foods that are high in fat, low in fiber, or overly processed, such as french fries, hamburgers, cookies, candies, and soda.  Drink enough fluid to keep your urine clear or pale yellow. General instructions  Exercise regularly or as told by your health care provider.  Go to the restroom when you have the urge to go. Do not hold it in.  Take over-the-counter and prescription medicines only as told  by your health care provider. These include any fiber supplements.  Practice pelvic floor retraining exercises, such as deep breathing while relaxing the lower abdomen and pelvic floor relaxation during bowel movements.  Watch your condition for any changes.  Keep all follow-up visits as told by your health care provider. This is important. Contact a health care provider if:  You have pain that gets worse.  You have a fever.  You do not have a bowel movement after 4 days.  You vomit.  You are not hungry.  You lose weight.  You are bleeding from the anus.  You have thin, pencil-like stools. Get help right away if:  You have a fever and your symptoms suddenly get worse.  You leak stool or have blood in your stool.  Your abdomen is bloated.  You have severe pain in your abdomen.  You feel dizzy or you faint. This information is not intended to replace advice given to you by your health care provider. Make sure you discuss any questions you have with your health care provider. Document Released: 05/04/2004 Document Revised: 02/24/2016 Document Reviewed: 01/25/2016 Elsevier Interactive Patient Education  2018 [employer].

## 2018-01-16 LAB — HSV(HERPES SIMPLEX VRS) I + II AB-IGG
HSV 1 Glycoprotein G Ab, IgG: <0.91 index (ref 0.00–0.90)
HSV 2 IgG, Type Spec: <0.91 index (ref 0.00–0.90)

## 2018-01-16 LAB — HEPATITIS PANEL, ACUTE
HEP B C IGM: NEGATIVE
HEP B S AG: NEGATIVE
Hep A IgM: NEGATIVE
Hep C Virus Ab: <0.1 s/co ratio (ref 0.0–0.9)

## 2018-01-16 LAB — GC/CHLAMYDIA PROBE AMP
Chlamydia trachomatis, NAA: NEGATIVE
NEISSERIA GONORRHOEAE BY PCR: NEGATIVE

## 2018-01-16 LAB — HIV ANTIBODY (ROUTINE TESTING W REFLEX): HIV Screen 4th Generation wRfx: NONREACTIVE

## 2018-01-16 LAB — RPR: RPR: NONREACTIVE

## 2018-01-17 ENCOUNTER — Telehealth: Payer: Self-pay | Admitting: Family Medicine

## 2018-01-17 ENCOUNTER — Encounter: Payer: Self-pay | Admitting: Family Medicine

## 2018-01-17 LAB — URINE CULTURE: Organism ID, Bacteria: NO GROWTH

## 2018-01-17 NOTE — Telephone Encounter (Signed)
Please let him know that all his labs came back nice and normal. Thanks!

## 2018-01-17 NOTE — Telephone Encounter (Signed)
Patient notified

## 2018-01-20 ENCOUNTER — Ambulatory Visit: Payer: 59 | Admitting: Unknown Physician Specialty

## 2018-01-29 ENCOUNTER — Telehealth: Payer: Self-pay | Admitting: Family Medicine

## 2018-01-29 MED ORDER — TAMSULOSIN HCL 0.4 MG PO CAPS: 0.4000 mg | ORAL_CAPSULE | Freq: Every day | ORAL | 3 refills | Status: DC

## 2018-01-29 NOTE — Telephone Encounter (Signed)
Patient notified

## 2018-01-29 NOTE — Telephone Encounter (Signed)
Last message closed by mistake. Please let him know that I'm going to send him through some medicine for his prostate and we'll see how he does at his follow up next week.

## 2018-01-29 NOTE — Telephone Encounter (Signed)
Copied from [place] [id number]. Topic: General - Other >> Jan 27, 2018 10:57 AM Conception Chancy, NT wrote: Reason for CRM: patient is calling and was seen on 01/15/18 for a enlarged prostate. He states the medicine is not helping and it is not better at all. Patient has a f/u appt on 6/19 and would like to know if he needs to wait until then, which will be when he finishes his antibiotic or if he needs to come in before then. Please advise.  >> Jan 29, 2018 10:03 AM Don Perking M wrote: Sorry for the delay, I just saw this message.  Please advise.

## 2018-02-03 ENCOUNTER — Ambulatory Visit: Payer: Self-pay | Admitting: Family Medicine

## 2018-02-03 NOTE — Telephone Encounter (Signed)
Patient seen 5/29/ 2019 by Dr Wynetta Emery for prostatitis  Pt has an appt in 2 days with Dr Wynetta Emery for followup  Pt taking Bactrim ds  Bid   - 4 pills left   Pt has been a headache off and on since since starting meds headache is worse for last several days  Pain scale 6 at this time no tylenol/ motrin today   Pt reports tylenol motrin helps when taking     Pt reports urinary  symptoms in general over the course of the last 3 weeks  are much better other  than mild lower abd discomfort at times.   Pt  Would like advice should he finish the last 2 days of the anti biotic with his more intense and persistent headache     Please advise.       Patient says  -  Thank You [place]  [phone]

## 2018-02-03 NOTE — Telephone Encounter (Signed)
If he is feeling really bad- he can stop it.

## 2018-02-04 NOTE — Telephone Encounter (Signed)
Patient notified

## 2018-02-05 ENCOUNTER — Ambulatory Visit: Payer: 59 | Admitting: Family Medicine

## 2018-02-05 ENCOUNTER — Encounter: Payer: Self-pay | Admitting: Family Medicine

## 2018-02-05 VITALS — BP 122/79 | HR 64 | Temp 98.0°F | Wt 170.4 lb

## 2018-02-05 DIAGNOSIS — N410 Acute prostatitis: Secondary | ICD-10-CM | POA: Diagnosis not present

## 2018-02-05 MED ORDER — TAMSULOSIN HCL 0.4 MG PO CAPS: 0.4000 mg | ORAL_CAPSULE | Freq: Every day | ORAL | 3 refills | Status: DC | PRN

## 2018-02-05 NOTE — Patient Instructions (Signed)
Prostatitis Prostatitis is swelling or inflammation of the prostate gland. The prostate is a walnut-sized gland that is involved in the production of semen. It is located below a man's bladder, in front of the rectum. There are four types of prostatitis:  Chronic nonbacterial prostatitis. This is the most common type of prostatitis. It may be associated with a viral infection or autoimmune disorder.  Acute bacterial prostatitis. This is the least common type of prostatitis. It starts quickly and is usually associated with a bladder infection, high fever, and shaking chills. It can occur at any age.  Chronic bacterial prostatitis. This type usually results from acute bacterial prostatitis that happens repeatedly (is recurrent) or has not been treated properly. It can occur in men of any age but is most common among middle-aged men whose prostate has begun to get larger. The symptoms are not as severe as symptoms caused by acute bacterial prostatitis.  Prostatodynia or chronic pelvic pain syndrome (CPPS). This type is also called pelvic floor disorder. It is associated with increased muscular tone in the pelvis surrounding the prostate. What are the causes? Bacterial prostatitis is caused by infection from bacteria. Chronic nonbacterial prostatitis may be caused by:  Urinary tract infections (UTIs).  Nerve damage.  A response by the body's disease-fighting system (autoimmune response).  Chemicals in the urine. The causes of the other types of prostatitis are usually not known. What are the signs or symptoms? Symptoms of this condition vary depending upon the type of prostatitis. If you have acute bacterial prostatitis, you may experience:  Urinary symptoms, such as:  Painful urination.  Burning during urination.  Frequent and sudden urges to urinate.  Inability to start urinating.  A weak or interrupted stream of urine.  Vomiting.  Nausea.  Fever.  Chills.  Inability to  empty the bladder completely.  Pain in the:  Muscles or joints.  Lower back.  Lower abdomen. If you have any of the other types of prostatitis, you may experience:  Urinary symptoms, such as:  Sudden urges to urinate.  Frequent urination.  Difficulty starting urination.  Weak urine stream.  Dribbling after urination.  Discharge from the urethra. The urethra is a tube that opens at the end of the penis.  Pain in the:  Testicles.  Penis or tip of the penis.  Rectum.  Area in front of the rectum and below the scrotum (perineum).  Problems with sexual function.  Painful ejaculation.  Bloody semen. How is this diagnosed? This condition may be diagnosed based on:  A physical and medical exam.  Your symptoms.  A urine test to check for bacteria.  An exam in which a health care provider uses a finger to feel the prostate (digital rectal exam).  A test of a sample of semen.  Blood tests.  Ultrasound.  Removal of prostate tissue to be examined under a microscope (biopsy).  Tests to check how your body handles urine (urodynamic tests).  A test to look inside your bladder or urethra (cystoscopy). How is this treated? Treatment for this condition depends on the type of prostatitis. Treatment may involve:  Medicines to relieve pain or inflammation.  Medicines to help relax your muscles.  Physical therapy.  Heat therapy.  Techniques to help you control certain body functions (biofeedback).  Relaxation exercises.  Antibiotic medicine, if your condition is caused by bacteria.  Warm water baths (sitz baths). Sitz baths help with relaxing your pelvic floor muscles, which helps to relieve pressure on the prostate. Follow   these instructions at home:  Take over-the-counter and prescription medicines only as told by your health care provider.  If you were prescribed an antibiotic, take it as told by your health care provider. Do not stop taking the  antibiotic even if you start to feel better.  If physical therapy, biofeedback, or relaxation exercises were prescribed, do exercises as instructed.  Take sitz baths as directed by your health care provider. For a sitz bath, sit in warm water that is deep enough to cover your hips and buttocks.  Keep all follow-up visits as told by your health care provider. This is important. Contact a health care provider if:  Your symptoms get worse.  You have a fever. Get help right away if:  You have chills.  You feel nauseous.  You vomit.  You feel light-headed or feel like you are going to faint.  You are unable to urinate.  You have blood or blood clots in your urine. This information is not intended to replace advice given to you by your health care provider. Make sure you discuss any questions you have with your health care provider. Document Released: 08/03/2000 Document Revised: 04/26/2016 Document Reviewed: 04/26/2016 Elsevier Interactive Patient Education  2017 [employer]  

## 2018-02-05 NOTE — Progress Notes (Signed)
BP 122/79 (BP Location: Left Arm, Patient Position: Sitting, Cuff Size: Normal)   Pulse 64   Temp 98 F (36.7 C)   Wt 170 lb 7 oz (77.3 kg)   SpO2 98%   BMI 23.77 kg/m    Subjective:    Patient ID: Brian Cox, male    DOB: 05-22-1976, 42 y.o.   MRN: [id number]  HPI: Haydn Hutsell is a 41 y.o. male  Chief Complaint  Patient presents with  . Prostatitis   Doing much much better. Finished the bactrim and is feeling well. Has not been taking the flomax because he doesn't want to take something daily. Has days when he doesn't feel great, but in general is feeling significantly better. No other concerns at this time.   Relevant past medical, surgical, family and social history reviewed and updated as indicated. Interim medical history since our last visit reviewed. Allergies and medications reviewed and updated.  Review of Systems  Constitutional: Negative.   Respiratory: Negative.   Cardiovascular: Negative.   Genitourinary: Negative for decreased urine volume, difficulty urinating, discharge, dysuria, enuresis, flank pain, frequency, genital sores, hematuria, penile pain, penile swelling, scrotal swelling, testicular pain and urgency.  Musculoskeletal: Negative.   Psychiatric/Behavioral: Negative.     Per HPI unless specifically indicated above     Objective:    BP 122/79 (BP Location: Left Arm, Patient Position: Sitting, Cuff Size: Normal)   Pulse 64   Temp 98 F (36.7 C)   Wt 170 lb 7 oz (77.3 kg)   SpO2 98%   BMI 23.77 kg/m   Wt Readings from Last 3 Encounters:  02/05/18 170 lb 7 oz (77.3 kg)  01/15/18 167 lb 6.4 oz (75.9 kg)  09/25/17 165 lb (74.8 kg)    Physical Exam  Constitutional: He is oriented to person, place, and time. He appears well-developed and well-nourished. No distress.  HENT:  Head: Normocephalic and atraumatic.  Right Ear: Hearing normal.  Left Ear: Hearing normal.  Nose: Nose normal.  Eyes: Conjunctivae and lids are normal. Right  eye exhibits no discharge. Left eye exhibits no discharge. No scleral icterus.  Cardiovascular: Normal rate, regular rhythm, normal heart sounds and intact distal pulses. Exam reveals no gallop and no friction rub.  No murmur heard. Pulmonary/Chest: Effort normal and breath sounds normal. No stridor. No respiratory distress. He has no wheezes. He has no rales. He exhibits no tenderness.  Abdominal: Soft. Bowel sounds are normal. He exhibits no distension and no mass. There is no tenderness. There is no rebound and no guarding. No hernia.  Musculoskeletal: Normal range of motion.  Neurological: He is alert and oriented to person, place, and time.  Skin: Skin is warm, dry and intact. Capillary refill takes less than 2 seconds. No rash noted. He is not diaphoretic. No erythema. No pallor.  Psychiatric: He has a normal mood and affect. His speech is normal and behavior is normal. Judgment and thought content normal. Cognition and memory are normal.  Nursing note and vitals reviewed.   Results for orders placed or performed in visit on 01/15/18  GC/Chlamydia Probe Amp  Result Value Ref Range   Chlamydia trachomatis, NAA Negative Negative   Neisseria gonorrhoeae by PCR Negative Negative  Urine Culture  Result Value Ref Range   Urine Culture, Routine Final report    Organism ID, Bacteria No growth   Microscopic Examination  Result Value Ref Range   WBC, UA 0-5 0 - 5 /hpf   RBC, UA 0-2 0 -  2 /hpf   Epithelial Cells (non renal) 0-10 0 - 10 /hpf   Bacteria, UA None seen None seen/Few  UA/M w/rflx Culture, Routine  Result Value Ref Range   Specific Gravity, UA 1.010 1.005 - 1.030   pH, UA 6.5 5.0 - 7.5   Color, UA Straw Yellow   Appearance Ur Clear Clear   Leukocytes, UA Negative Negative   Protein, UA Negative Negative/Trace   Glucose, UA Negative Negative   Ketones, UA Negative Negative   RBC, UA Trace (A) Negative   Bilirubin, UA Negative Negative   Urobilinogen, Ur 0.2 0.2 - 1.0 mg/dL    Nitrite, UA Negative Negative   Microscopic Examination See below:   HIV antibody  Result Value Ref Range   HIV Screen 4th Generation wRfx Non Reactive Non Reactive  RPR  Result Value Ref Range   RPR Ser Ql Non Reactive Non Reactive  Hepatitis, Acute  Result Value Ref Range   Hep A IgM Negative Negative   Hepatitis B Surface Ag Negative Negative   Hep B C IgM Negative Negative   Hep C Virus Ab <0.1 0.0 - 0.9 s/co ratio  HSV(herpes simplex vrs) 1+2 ab-IgG  Result Value Ref Range   HSV 1 Glycoprotein G Ab, IgG <0.91 0.00 - 0.90 index   HSV 2 IgG, Type Spec <0.91 0.00 - 0.90 index      Assessment & Plan:   Problem List Items Addressed This Visit    None    Visit Diagnoses    Acute prostatitis    -  Primary   Resolved. Continue saw palmetto. Will take flomax PRN. Discussed prostatits cushion. Call with any concerns.        Follow up plan: Return if symptoms worsen or fail to improve.

## 2018-02-07 ENCOUNTER — Telehealth: Payer: Self-pay | Admitting: Family Medicine

## 2018-02-07 DIAGNOSIS — N410 Acute prostatitis: Secondary | ICD-10-CM

## 2018-02-07 NOTE — Telephone Encounter (Signed)
Copied from [place] [phone]. Topic: Referral - Request >> Feb 07, 2018 11:36 AM Brian Cox, NT wrote: Reason for CRM: patient is requesting a referral to [facility] in regards to prostatitis. Please advise

## 2018-02-11 ENCOUNTER — Ambulatory Visit: Payer: 59 | Admitting: Urology

## 2018-02-11 ENCOUNTER — Encounter: Payer: Self-pay | Admitting: Urology

## 2018-02-11 VITALS — BP 110/68 | HR 63 | Ht 71.0 in | Wt 165.0 lb

## 2018-02-11 DIAGNOSIS — N4282 Prostatosis syndrome: Secondary | ICD-10-CM

## 2018-02-11 DIAGNOSIS — R1030 Lower abdominal pain, unspecified: Secondary | ICD-10-CM | POA: Diagnosis not present

## 2018-02-11 DIAGNOSIS — N410 Acute prostatitis: Secondary | ICD-10-CM

## 2018-02-11 LAB — URINALYSIS, COMPLETE
BILIRUBIN UA: NEGATIVE
GLUCOSE, UA: NEGATIVE
Leukocytes, UA: NEGATIVE
NITRITE UA: NEGATIVE
PH UA: 6 (ref 5.0–7.5)
PROTEIN UA: NEGATIVE
Specific Gravity, UA: 1.025 (ref 1.005–1.030)
Urobilinogen, Ur: 0.2 mg/dL (ref 0.2–1.0)

## 2018-02-11 LAB — BLADDER SCAN AMB NON-IMAGING

## 2018-02-11 LAB — MICROSCOPIC EXAMINATION
Bacteria, UA: NONE SEEN
Epithelial Cells (non renal): NONE SEEN /hpf (ref 0–10)
WBC, UA: NONE SEEN /hpf (ref 0–5)

## 2018-02-11 NOTE — Progress Notes (Signed)
02/11/2018 3:59 PM   Brian Cox 15-Dec-1975 [id number]  Referring provider: Guadalupe Maple, MD [address] [place], [place] [address]  Chief Complaint  Patient presents with  . Prostatitis    New Patient    HPI: Patient is a 42 year old Caucasian male who has been referred by Dr. Park Cox for prostatitis.  He states 1 month ago he had a sudden onset of urinary frequency, pain after urination, nocturia, postvoid dribbling, pain at the head of his penis and suprapubically and a weak urinary stream.  He was started on Bactrim DS and took the antibiotic for 3 weeks.  He is now currently taking saw palmetto and states he is about 90% improved.    Patient denies any gross hematuria, dysuria or suprapubic/flank pain.  Patient denies any fevers, chills, nausea or vomiting.   His UA is negative.  His PVR is 0 mL.  STI testing at his PCP's office were negative.    He does not have a prior history of nephrolithiasis, trauma to the GU area or infection to the GU area.  He is not a smoker.  His father has a history of kidney stones.  He does not have a family history of prostate cancer.  PMH: No past medical history on file.  Surgical History: No past surgical history on file.  Home Medications:  Allergies as of 02/11/2018      Reactions   Ciprofloxacin Hcl Swelling   Donnatal  [pb-hyoscy-atropine-scopolamine] Swelling   Levofloxacin Swelling      Medication List        Accurate as of 02/11/18  3:59 PM. Always use your most recent med list.          saw palmetto 500 MG capsule Take 500 mg by mouth 3 (three) times daily.   tamsulosin 0.4 MG Caps capsule Commonly known as:  FLOMAX Take 1 capsule (0.4 mg total) by mouth daily as needed.   Vitamin D3 5000 units Caps Take 5,000 Units by mouth daily.       Allergies:  Allergies  Allergen Reactions  . Ciprofloxacin Hcl Swelling  . Donnatal  [Pb-Hyoscy-Atropine-Scopolamine] Swelling  . Levofloxacin  Swelling    Family History: Family History  Problem Relation Age of Onset  . Hyperlipidemia Mother   . Hypertension Father   . Aneurysm Maternal Grandmother   . Heart disease Maternal Grandfather     Social History:  reports that he has never smoked. He has never used smokeless tobacco. He reports that he does not drink alcohol or use drugs.  ROS: UROLOGY Frequent Urination?: Yes Hard to postpone urination?: No Burning/pain with urination?: Yes Get up at night to urinate?: Yes Leakage of urine?: Yes Urine stream starts and stops?: No Trouble starting stream?: No Do you have to strain to urinate?: No Blood in urine?: No Urinary tract infection?: No Sexually transmitted disease?: No Injury to kidneys or bladder?: No Painful intercourse?: No Weak stream?: Yes Erection problems?: No Penile pain?: Yes  Gastrointestinal Nausea?: No Vomiting?: No Indigestion/heartburn?: No Diarrhea?: No Constipation?: No  Constitutional Fever: No Night sweats?: No Weight loss?: No Fatigue?: No  Skin Skin rash/lesions?: No Itching?: No  Eyes Blurred vision?: No Double vision?: No  Ears/Nose/Throat Sore throat?: No Sinus problems?: No  Hematologic/Lymphatic Swollen glands?: No Easy bruising?: No  Cardiovascular Leg swelling?: No Chest pain?: No  Respiratory Cough?: No Shortness of breath?: No  Endocrine Excessive thirst?: No  Musculoskeletal Back pain?: No Joint pain?: No  Neurological  Headaches?: No Dizziness?: No  Psychologic Depression?: No Anxiety?: No  Physical Exam: BP 110/68   Pulse 63   Ht 5\' 11"  (1.803 m)   Wt 165 lb (74.8 kg)   BMI 23.01 kg/m   Constitutional:  Well nourished. Alert and oriented, No acute distress. HEENT: [place] AT, moist mucus membranes.  Trachea midline, no masses. Cardiovascular: No clubbing, cyanosis, or edema. Respiratory: Normal respiratory effort, no increased work of breathing. GI: Abdomen is soft, non tender, non  distended, no abdominal masses. Liver and spleen not palpable.  No hernias appreciated.  Stool sample for occult testing is not indicated.   GU: No CVA tenderness.  No bladder fullness or masses.  Patient with circumcised phallus.  Urethral meatus is patent.  No penile discharge. No penile lesions or rashes. Scrotum without lesions, cysts, rashes and/or edema.  Testicles are located scrotally bilaterally. No masses are appreciated in the testicles. Left and right epididymis are normal. Rectal: Patient with  normal sphincter tone. Anus and perineum without scarring or rashes. No rectal masses are appreciated. Prostate is approximately 35 grams, no nodules are appreciated. Seminal vesicles are normal. Skin: No rashes, bruises or suspicious lesions. Lymph: No cervical or inguinal adenopathy. Neurologic: Grossly intact, no focal deficits, moving all 4 extremities. Psychiatric: Normal mood and affect.  Laboratory Data: Lab Results  Component Value Date   WBC 6.4 04/01/2017   HGB 16.1 04/01/2017   HCT 46.8 04/01/2017   MCV 86 04/01/2017   PLT 295 04/01/2017    Lab Results  Component Value Date   CREATININE 0.98 04/01/2017    No results found for: PSA  No results found for: TESTOSTERONE  No results found for: HGBA1C  Lab Results  Component Value Date   TSH 1.640 04/01/2017       Component Value Date/Time   CHOL 162 09/25/2017 0942   HDL 38 (L) 09/25/2017 0942   CHOLHDL 4.3 09/25/2017 0942   LDLCALC 114 (H) 09/25/2017 0942    Lab Results  Component Value Date   AST 24 09/25/2017   Lab Results  Component Value Date   ALT 31 09/25/2017   No components found for: ALKALINEPHOPHATASE No components found for: BILIRUBINTOTAL  No results found for: ESTRADIOL  Urinalysis See HPI and Epic.   I have reviewed the labs.   Pertinent Imaging: Results for Brian, Cox (MRN [id number]) as of 02/11/2018 20:51  Ref. Range 02/11/2018 15:28  Scan Result Unknown 33ml      Assessment & Plan:    1. Prostatitis Explained to the patient that his urinary symptoms may not be due to prostatitis but to other etiologies such as nephrolithiasis, urethral strictures and/or other pelvic abnormalities  Urinalysis, Complete CULTURE, URINE COMPREHENSIVE HOLDBLADDER SCAN AMB NON-IMAGING CT scan at this time would be helpful to evaluate for nephrolithiasis or other anatomical abnormalities that may be contributing to his symptoms If the CT scan returns negative we will then proceed with cystoscopic he to evaluate the internal structures of the bladder I have explained to the patient that they will  be scheduled for a cystoscopy in our office to evaluate their bladder.  The cystoscopy consists of passing a tube with a lens up through their urethra and into their urinary bladder.   We will inject the urethra with a lidocaine gel prior to introducing the cystoscope to help with any discomfort during the procedure.   After the procedure, they might experience blood in the urine and discomfort with urination.  This will abate  after the first few voids.  I have  encouraged the patient to increase water intake  during this time.  Patient denies any allergies to lidocaine.     Return for I will call patient with results.  These notes generated with voice recognition software. I apologize for typographical errors.  Zara Council, PA-C  [facility] [address]  [place], [place] [address] [phone]

## 2018-02-12 ENCOUNTER — Ambulatory Visit
Admission: RE | Admit: 2018-02-12 | Discharge: 2018-02-12 | Disposition: A | Discharge status: Home / Self Care | Payer: Commercial Managed Care - HMO | Source: Ambulatory Visit | Attending: Urology | Admitting: Urology

## 2018-02-12 DIAGNOSIS — R1030 Lower abdominal pain, unspecified: Secondary | ICD-10-CM | POA: Insufficient documentation

## 2018-02-12 DIAGNOSIS — N200 Calculus of kidney: Secondary | ICD-10-CM | POA: Insufficient documentation

## 2018-02-13 ENCOUNTER — Telehealth: Payer: Self-pay | Admitting: Urology

## 2018-02-13 ENCOUNTER — Other Ambulatory Visit: Payer: Self-pay | Admitting: Urology

## 2018-02-13 DIAGNOSIS — N4282 Prostatosis syndrome: Secondary | ICD-10-CM

## 2018-02-13 NOTE — Progress Notes (Signed)
Orders in for PT

## 2018-02-13 NOTE — Telephone Encounter (Signed)
Please let Catalina Antigua know that his CT results are still pending at this time.  I will call him when I get the results.  Hopefully, it won't be too much longer.

## 2018-02-14 LAB — CULTURE, URINE COMPREHENSIVE

## 2018-02-14 NOTE — Telephone Encounter (Signed)
See my chart message

## 2018-02-27 ENCOUNTER — Other Ambulatory Visit: Payer: Self-pay

## 2018-02-27 ENCOUNTER — Encounter: Payer: Self-pay | Admitting: Physical Therapy

## 2018-02-27 ENCOUNTER — Ambulatory Visit: Payer: Commercial Managed Care - HMO | Attending: Urology | Admitting: Physical Therapy

## 2018-02-27 DIAGNOSIS — R278 Other lack of coordination: Secondary | ICD-10-CM

## 2018-02-27 DIAGNOSIS — R2689 Other abnormalities of gait and mobility: Secondary | ICD-10-CM | POA: Diagnosis present

## 2018-02-27 NOTE — Therapy (Addendum)
[place] MAIN [facility] SERVICES [address] [place], [place], [address] Phone: [phone]   Fax:  [phone]  Physical Therapy Evaluation  Patient Details  Name: Brian Cox MRN: [id number] Date of Birth: 04-17-1976 Referring Provider: Zara Council   Encounter Date: 02/27/2018  PT End of Session - 02/27/18 1409    Visit Number  1    Number of Visits  12    Date for PT Re-Evaluation  05/22/18    PT Start Time  3545    PT Stop Time  1310    PT Time Calculation (min)  65 min       History reviewed. No pertinent past medical history.  History reviewed. No pertinent surgical history.  There were no vitals filed for this visit.   Subjective Assessment - 02/27/18 1216    Subjective  Pt states his low abdominal/pelvic pain started on 01/09/18 with feeling the following Sx: deep ache, feeling he had to go the bathroom but nothing comes, sometimes burning sensation when going to the bathroom, and more frequently he had dribbling after urination. Pt underwent tests with urologist and the results were neg. The deep ache was worst with sitting. 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 Deep ache is less with standing and increased activities.  Pt underwent urological tests which were all neg.  Pt started to take [facility] as suggested by a friend and he has found it to be helpful.  At onset of Sx, pain was 6/10, currently, 0-1/10. Last week, frequency and dribbling has decreased by 90-95%.  Pt goes to the bathroom 1 every 4 hours. Daily water intake: 4-5 ( 16 floz), no sodas, juice, coffee. Pt has to strain sometimes to completely finish urination. Bowel movements occur once every 1-2 x day without straining. Sometimes he delays urination if he is busy.  Denied falls, LBP.  Physical activity: 3 miles running on a track daily for past 2-3 years without stretches. As a Architectural technologist, pt sings, plays keyboard  instruments, and conducts music 1 hr.          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     Patient Stated Goals  eliminate the discomfort and get back to a regular pattern to not having pain down there          [facility] PT Assessment - 02/27/18 1414      Assessment   Medical Diagnosis  Prostatitis     Referring Provider  Zara Council      Precautions   Precautions  None      Restrictions   Weight Bearing Restrictions  No      Balance Screen   Has the patient fallen in the past 6 months  No      Observation/Other Assessments   Observations  slumped sitting, forward head, thoracic kyphosis      Coordination   Gross Motor Movements are Fluid and Coordinated  -- abdominal straining w/ inhalation, limited ribcage expansion    Fine Motor Movements are Fluid and Coordinated  -- proper pelvic floor with singing      Posture/Postural Control   Posture Comments  lumbopelvic pertubation R SLS and posterior sling MMT       Strength   Overall Strength Comments  hip ext in prone 4-/5, hip abd 3+/5 R, 4/5 L  Palpation   Spinal mobility  hypomobility at thoracic , increased tensions paraspinal ,                Objective measurements completed on examination: See above findings.    Pelvic Floor Special Questions - 02/27/18 1415    External Perineal Exam  through clothing, no tenderness, increase tightenss in anterior triangle        [facility] Adult PT Treatment/Exercise - 02/27/18 1413      Neuro Re-ed    Neuro Re-ed Details   see pt instructions      Manual Therapy   Manual therapy comments  MWM/ STM along medial scapula/ parsapinals, PA mob Grade II along thoracic segments                   PT Long Term Goals - 02/27/18 1410      PT LONG TERM GOAL #1   Title  Pt will decrease his NIH-CPSI score from 26% to < 20% in order to restore pelvic floor function    Time  12    Period  Weeks    Status   New    Target Date  05/22/18      PT LONG TERM GOAL #2   Title  Pt will demo less abldominal bulging with inhalation and less separation of 2 fingers to < 1 finger below umbilicus for improve intraabdominal pressure support for sitting/ standing for long periods    Time  4    Period  Weeks    Status  New    Target Date  03/27/18      PT LONG TERM GOAL #3   Title  Pt will demo increased thoracic mobility, less paraspinal tightness, and diaphragmatic lateral excursion / depression in order to sing and run with less risk for injuries    Time  6    Period  Weeks    Status  New    Target Date  04/10/18      PT LONG TERM GOAL #4   Title  Pt will report no discomfort in suprapubic area across 2 weeks in order to improve QOL    Time  12    Period  Weeks    Status  New    Target Date  05/22/18             Plan - 02/27/18 1419    Clinical Impression Statement Pt is a 42 yo male w/ complaints of  discomfort/ deep ache  at low abdominal area, urinary frequency, and  post-urine dribble. Pt has been cleared by his urologist as his urology tests had negative findings.  These Sx started 2 months ago and his Sx have improved by 90-95% after pt started taking herbal supplement Saw Palmetto as recommended by a friend. Pt would like to address his remaining Sx with pelvic PT.  Upon PT assessment, pt showed significantly hypomobile thoracic spinals, increased  mm tensions at paraspinals, anterior pelvic floor mm, small diastasis  recti below umbilicus, and abdominal bulging with breathing along with  limited diaphragmatic lateral excursion /depression. Pt also showed  additional signs of poor postural stability with R SLS, R glut strength  weakness. Pt showed poor posture and lacked a flexibility program with his regular running regimen. Following Tx, pt showed increased thoracic  mobility. Plan to progress with deep core strengthening to address  diastasis recti and address pelvic floor  tightness at next session.        Clinical  Presentation  Evolving    Clinical Decision Making  Moderate    Rehab Potential  Good    PT Frequency  2x / week    PT Duration  12 weeks    PT Treatment/Interventions  Moist Heat;Stair training;Functional mobility training;Electrical Stimulation;Therapeutic activities;Therapeutic exercise;Neuromuscular re-education;Gait training;Patient/family education;Manual lymph drainage;Scar mobilization;Passive range of motion;Energy conservation    Consulted and Agree with Plan of Care  Patient       Patient will benefit from skilled therapeutic intervention in order to improve the following deficits and impairments:  Increased muscle spasms, Decreased safety awareness, Decreased endurance, Pain, Decreased scar mobility, Improper body mechanics, Hypomobility, Decreased strength, Decreased mobility, Postural dysfunction, Decreased coordination  Visit Diagnosis: Other lack of coordination  Other abnormalities of gait and mobility     Problem List Patient Active Problem List   Diagnosis Date Noted  . Hypercholesterolemia 09/25/2017  . Lipoma of neck 09/25/2017    Jerl Mina ,PT, DPT, E-RYT  02/27/2018, 2:46 PM  [place] MAIN [facility] SERVICES [address] [place], [place], [address] Phone: [phone]   Fax:  [phone]  Name: Brian Cox MRN: [id number] Date of Birth: 1976/05/01

## 2018-02-27 NOTE — Patient Instructions (Signed)
   midback mobility Open book ( handout)   At work: minisquat with hands behind back, fingers interlaced, rise up with chest lift  5 x    Arm swings

## 2018-02-27 NOTE — Addendum Note (Signed)
Addended by: Jerl Mina on: 02/27/2018 02:50 PM   Modules accepted: Orders

## 2018-03-03 ENCOUNTER — Ambulatory Visit: Payer: 59 | Admitting: Urology

## 2018-03-04 ENCOUNTER — Ambulatory Visit: Payer: Commercial Managed Care - HMO | Admitting: Physical Therapy

## 2018-03-04 DIAGNOSIS — R278 Other lack of coordination: Secondary | ICD-10-CM

## 2018-03-04 DIAGNOSIS — R2689 Other abnormalities of gait and mobility: Secondary | ICD-10-CM

## 2018-03-04 NOTE — Therapy (Signed)
[place] MAIN [facility] SERVICES [address] [place], [place], [address] Phone: [phone]   Fax:  [phone]  Physical Therapy Treatment  Patient Details  Name: Brian Cox MRN: [id number] Date of Birth: 08-10-1976 Referring Provider: Zara Council   Encounter Date: 03/04/2018  PT End of Session - 03/04/18 1516    Visit Number  2    Number of Visits  12    Date for PT Re-Evaluation  05/22/18    PT Start Time  1512    PT Stop Time  1605    PT Time Calculation (min)  53 min       No past medical history on file.  No past surgical history on file.  There were no vitals filed for this visit.  Subjective Assessment - 03/04/18 1516    Subjective  Pt reported very little pain in one week with 95% improvement since the last session. There has been little dribbling post urination and it is not as bad as it was.     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     Patient Stated Goals  eliminate the discomfort and get back to a regular pattern to not having pain down there          [facility] PT Assessment - 03/04/18 1553      Coordination   Gross Motor Movements are Fluid and Coordinated  -- increased diaphragm lateral excursion/pelvic floor length                Pelvic Floor Special Questions - 03/04/18 1550    External Perineal Exam  tight mm and tenderness ischiocavernossu. bulbospongiosus , perineum, transverse perineal superficial and deep  B         [facility] Adult PT Treatment/Exercise - 03/04/18 1551      Neuro Re-ed    Neuro Re-ed Details   see pt instructions      Exercises   Exercises  -- see pt instructions       Manual Therapy   Manual therapy comments  external releases with MWM which pt tolerated better than sustained pressure. worked on problem areas noted in assessment              PT Education - 03/04/18 1603    Education Details  HEP    Person(s) Educated  Patient    Methods  Explanation    Comprehension  Verbalized understanding;Returned demonstration;Verbal cues required          PT Long Term Goals - 02/27/18 1410      PT LONG TERM GOAL #1   Title  Pt will decrease his NIH-CPSI score from 26% to < 20% in order to restore pelvic floor function    Time  12    Period  Weeks    Status  New    Target Date  05/22/18      PT LONG TERM GOAL #2   Title  Pt will demo less abldominal bulging with inhalation and less separation of 2 fingers to < 1 finger below umbilicus for improve intraabdominal pressure support for sitting/ standing for long periods    Time  4    Period  Weeks    Status  New    Target Date  03/27/18      PT LONG TERM GOAL #3   Title  Pt will demo increased thoracic mobility, less paraspinal tightness, and diaphragmatic lateral  excursion / depression in order to sing and run with less risk for injuries    Time  6    Period  Weeks    Status  New    Target Date  04/10/18      PT LONG TERM GOAL #4   Title  Pt will report no discomfort in suprapubic area across 2 weeks in order to improve QOL    Time  12    Period  Weeks    Status  New    Target Date  05/22/18            Plan - 03/04/18 1603    Clinical Impression Statement  Pt has had 95% improvement with pelvic pain. Pt had remaining issue with a little post -urination dribble. Pt showed improved posture and less slumped posture. Pt had increased tensions with pelvic floor mm which external manual Tx helped to decrease. Pt was provided HEP with pelvic and hip stretches for home and work routine. Pt demo'd correctly. Anticipate pt will have more complete urination with more lengthened pelvic floor mm with new HEP. Pt continues to benefit from skilled PT.        Patient will benefit from skilled therapeutic intervention in order to improve the following deficits and impairments:  Impaired sensation  Visit Diagnosis: Other lack of  coordination  Other abnormalities of gait and mobility     Problem List Patient Active Problem List   Diagnosis Date Noted  . Hypercholesterolemia 09/25/2017  . Lipoma of neck 09/25/2017    Jerl Mina ,PT, DPT, E-RYT  03/04/2018, 4:12 PM  [place] MAIN [facility] SERVICES [address] [place], [place], [address] Phone: [phone]   Fax:  [phone]  Name: Brian Cox MRN: [id number] Date of Birth: 07-11-1976

## 2018-03-04 NOTE — Patient Instructions (Signed)
Morning/ night in bed:   Happy baby ( strap on ballmounds, upper arms are down on bed)  5 breaths   Figure-4  5-10 reps   Cross over thighs  5-10 reps   Hamstring stretch ( opp knee bent, strap under ballmound, knee bent back and forth), knee/ toes point towards armpits  5-10 reps     childs poses rocking Toes tucked, shoulders downa nd back, paws grip , hands shoulder width apart  5-10 reps  __________  Standing:     Hip flexor stretch by wall    foot on chair, leaning onto front thigh and back and forth 5reps    foot on chair, front knee above ankle, then twist  Breathe 3 breaths     Seated: Wide thighs,  bent above ankle. Lean to each side with arm overhead    Side lunge, bring back knee back   ____________  When playing piano  Place one knee above ankle, foot flat

## 2018-03-07 ENCOUNTER — Ambulatory Visit: Payer: Commercial Managed Care - HMO | Admitting: Physical Therapy

## 2018-03-07 DIAGNOSIS — R2689 Other abnormalities of gait and mobility: Secondary | ICD-10-CM

## 2018-03-07 DIAGNOSIS — R278 Other lack of coordination: Secondary | ICD-10-CM

## 2018-03-07 NOTE — Therapy (Signed)
[place] MAIN [facility] SERVICES [address] [place], [place], [address] Phone: [phone]   Fax:  [phone]  Physical Therapy Treatment  Patient Details  Name: Fergus Throne MRN: [id number] Date of Birth: 12-01-1975 Referring Provider: Zara Council   Encounter Date: 03/07/2018  PT End of Session - 03/07/18 1001    Visit Number  3    Number of Visits  12    Date for PT Re-Evaluation  05/22/18    PT Start Time  0076    PT Stop Time  0955    PT Time Calculation (min)  43 min    Activity Tolerance  Patient tolerated treatment well;No increased pain    Behavior During Therapy  [facility] for tasks assessed/performed       No past medical history on file.  No past surgical history on file.  There were no vitals filed for this visit.  Subjective Assessment - 03/07/18 0914    Subjective  Pt felt a deep ache 3-4/10 in the pelvic area and it was not as bad as it was originally. But pt is worried ebcause he is traveling out of the country.     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     Patient Stated Goals  eliminate the discomfort and get back to a regular pattern to not having pain down there              Assessment:  Upon arrival: slumped posture, tension upon face and body AFter mindfulness training: more upright posture, brighter affect, and less tensions upon face          [facility] Adult PT Treatment/Exercise - 03/07/18 1000      Neuro Re-ed    Neuro Re-ed Details   see pt instructions             PT Education - 03/07/18 1001    Education Details  HEP    Person(s) Educated  Patient    Methods  Explanation;Demonstration;Tactile cues;Verbal cues;Handout    Comprehension  Returned demonstration;Verbalized understanding          PT Long Term Goals - 02/27/18 1410      PT LONG TERM GOAL #1   Title  Pt will decrease his NIH-CPSI score from 26% to < 20%  in order to restore pelvic floor function    Time  12    Period  Weeks    Status  New    Target Date  05/22/18      PT LONG TERM GOAL #2   Title  Pt will demo less abldominal bulging with inhalation and less separation of 2 fingers to < 1 finger below umbilicus for improve intraabdominal pressure support for sitting/ standing for long periods    Time  4    Period  Weeks    Status  New    Target Date  03/27/18      PT LONG TERM GOAL #3   Title  Pt will demo increased thoracic mobility, less paraspinal tightness, and diaphragmatic lateral excursion / depression in order to sing and run with less risk for injuries    Time  6    Period  Weeks    Status  New    Target Date  04/10/18      PT LONG TERM GOAL #4   Title  Pt will report no discomfort in suprapubic area across 2 weeks in order  to improve QOL    Time  12    Period  Weeks    Status  New    Target Date  05/22/18            Plan - 03/07/18 1002    Clinical Impression Statement  Pt arrived with worry about his deep ache Sx that has returned since last session at 3-4/10 but not as bad as it was before. Pt expressed his worry about the return of his Sx because he will be travelling out of the country. DPT provided pt about pain science and research on application of mindfulness technique for pain management and guided pt through mindfulness technique after which pt reported he felt much better and had less pain after the relaxation training. Pt appeared with more upright posture and less tensions in his body and face.  Pt declined reassessment of his pelvic floor mm today. Pt plans to return to PT after his trip. Pt continues to benefit from skilled PT.        Patient will benefit from skilled therapeutic intervention in order to improve the following deficits and impairments:  Impaired sensation  Visit Diagnosis: Other lack of coordination  Other abnormalities of gait and mobility     Problem List Patient Active  Problem List   Diagnosis Date Noted  . Hypercholesterolemia 09/25/2017  . Lipoma of neck 09/25/2017    Jerl Mina  ,PT, DPT, E-RYT  03/07/2018, 10:08 AM  [place] MAIN [facility] SERVICES [address] [place], [place], [address] Phone: [phone]   Fax:  [phone]  Name: Login Muckleroy MRN: [id number] Date of Birth: 04-04-1976

## 2018-03-07 NOTE — Patient Instructions (Addendum)
Body scan in the morning and evening Relaxation music in the evening   Seated body scan practice during the day    Refer to email for research on body scan technique/ mindfulness on pain

## 2018-03-12 ENCOUNTER — Ambulatory Visit: Payer: 59 | Admitting: Urology

## 2018-03-14 ENCOUNTER — Encounter: Payer: 59 | Admitting: Physical Therapy

## 2018-03-20 ENCOUNTER — Encounter: Payer: 59 | Admitting: Physical Therapy

## 2018-03-28 ENCOUNTER — Ambulatory Visit: Payer: Commercial Managed Care - HMO | Attending: Urology | Admitting: Physical Therapy

## 2018-03-28 DIAGNOSIS — R2689 Other abnormalities of gait and mobility: Secondary | ICD-10-CM

## 2018-03-28 DIAGNOSIS — R278 Other lack of coordination: Secondary | ICD-10-CM | POA: Insufficient documentation

## 2018-03-28 NOTE — Therapy (Signed)
[place] MAIN [facility] SERVICES [address] [place], [place], [address] Phone: [phone]   Fax:  [phone]  Physical Therapy Treatment / Discharge Summary   Patient Details  Name: Brian Cox MRN: [id number] Date of Birth: 03/11/1976 Referring Provider: Zara Council   Encounter Date: 03/28/2018  PT End of Session - 03/28/18 1043    Visit Number  4    Number of Visits  12    Date for PT Re-Evaluation  05/22/18    PT Start Time  1007    PT Stop Time  1020    PT Time Calculation (min)  13 min       No past medical history on file.  No past surgical history on file.  There were no vitals filed for this visit.  Subjective Assessment - 03/28/18 1011    Subjective  Pt had felt horrible after last session and had pain with sitting. Pt used the body scan while sittign on the plane which helped him tolerate sitting. At 1/3 of his trip, the pain got better but pt did not get to do his exercises. Pt is today back at 95% better with his pelvic pain. Pt doe snot have pain in the pubic area anymore. Pt still has issues with dribbling. Pt will be seeing his PCP  for his physical exam. Pt would like to hold off on Pelvic PT. The muscle releases done in PT sessions had flared up his pain because he felt himself tensing up which felt counterproductive. It took 2 weeks after doing the muscle releases for pt to get relieve because he felt so sore. Pt took Ibprofen.      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     Patient Stated Goals  eliminate the discomfort and get back to a regular pattern to not having pain down there          Reassessed goals           PT Education - 03/28/18 1042    Education Details  d/c with preventative practices education    Person(s) Educated  Patient          PT Long Term Goals - 03/28/18 1018      PT LONG TERM GOAL #1   Title  Pt will decrease  his NIH-CPSI score from 26% to < 20% in order to restore pelvic floor function  ( 8//9: 7%)     Time  12    Period  Weeks    Status  Achieved      PT LONG TERM GOAL #2   Title  Pt will demo less abdominal bulging with inhalation and less separation of 2 fingers to < 1 finger below umbilicus for improve intraabdominal pressure support for sitting/ standing for long periods    Time  4    Period  Weeks    Status  Achieved      PT LONG TERM GOAL #3   Title  Pt will demo increased thoracic mobility, less paraspinal tightness, and diaphragmatic lateral excursion / depression in order to sing and 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  Pt's NIH-CPSI score decerased from 28% to 7%, indicating signficant pelvic foor function. Pt's diastasis recti resolved, thoracic kyphosis and spinal mm tensions decreased, and his posture improved.  These improvements signify improved intra-abdominal pressure system for better pelvic function.  Pt rem,ains compliant with stretches and mrelaxation practices. Pt did not tolerate manual Tx with hans on mm releases but responded better to stretches and mindfulenss relaxation practices after manual Tx caused a relapse of pain. Pt was able to self-manage and return to 95% improvement after relapse.  Pt feels his pelvic pain Sx have improved "Quite A Bit Better"  and urinary Sx has improved "Somewhat Better" based on the [facility].  Pt's remaining issue is related to post-urination dribble which pt will be discussing with his providers. Pt was educated on the benefits of maintaining his HEP and if in the future, he would like to  learn proper exercises for overall strength and conditioning, pt would benefit from skilled Pelvic PT. Pt is ready for d/c at this time.         Patient will benefit from skilled therapeutic intervention in order to improve the following deficits and impairments:  Impaired sensation  Visit Diagnosis: Other lack of coordination  Other abnormalities of gait and mobility     Problem List Patient Active Problem List   Diagnosis Date Noted  . Hypercholesterolemia 09/25/2017  . Lipoma of neck 09/25/2017    Jerl Mina ,PT, DPT, E-RYT  03/28/2018, 10:50 AM  [place] MAIN [facility] SERVICES [place], [place], [address] Phone: [phone]   Fax:  [phone]  Name: Brian Cox MRN: [id number] Date of Birth: [birth year]

## 2018-04-14 ENCOUNTER — Encounter: Payer: 59 | Admitting: Family Medicine

## 2018-04-22 ENCOUNTER — Ambulatory Visit (INDEPENDENT_AMBULATORY_CARE_PROVIDER_SITE_OTHER): Payer: 59 | Admitting: Family Medicine

## 2018-04-22 ENCOUNTER — Encounter: Payer: Self-pay | Admitting: Family Medicine

## 2018-04-22 VITALS — BP 119/75 | HR 65 | Ht 71.2 in | Wt 168.5 lb

## 2018-04-22 DIAGNOSIS — R748 Abnormal levels of other serum enzymes: Secondary | ICD-10-CM | POA: Diagnosis not present

## 2018-04-22 DIAGNOSIS — Z0000 Encounter for general adult medical examination without abnormal findings: Secondary | ICD-10-CM

## 2018-04-22 DIAGNOSIS — E7800 Pure hypercholesterolemia, unspecified: Secondary | ICD-10-CM

## 2018-04-22 DIAGNOSIS — Z131 Encounter for screening for diabetes mellitus: Secondary | ICD-10-CM

## 2018-04-22 DIAGNOSIS — Z23 Encounter for immunization: Secondary | ICD-10-CM

## 2018-04-22 DIAGNOSIS — Z13220 Encounter for screening for lipoid disorders: Secondary | ICD-10-CM

## 2018-04-22 DIAGNOSIS — Z1329 Encounter for screening for other suspected endocrine disorder: Secondary | ICD-10-CM | POA: Diagnosis not present

## 2018-04-22 DIAGNOSIS — Z125 Encounter for screening for malignant neoplasm of prostate: Secondary | ICD-10-CM

## 2018-04-22 NOTE — Progress Notes (Signed)
BP 119/75   Pulse 65   Ht 5' 11.2" (1.808 m)   Wt 168 lb 8 oz (76.4 kg)   SpO2 100%   BMI 23.37 kg/m    Subjective:    Patient ID: Brian Cox, male    DOB: Apr 27, 1976, 42 y.o.   MRN: [id number]  HPI: [name] is a 42 y.o. male  Chief Complaint  Patient presents with  . Annual Exam   Patient follow-up did well with travel this summer. 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  Has decided not to take tamsulosin still has some residual prostate type symptoms.  Has taken some salt palmetto with no real effect. Otherwise doing well with no complaints or problems.  Relevant past medical, surgical, family and social history reviewed and updated as indicated. Interim medical history since our last visit reviewed. Allergies and medications reviewed and updated.  Review of Systems  Constitutional: Negative.   HENT: Negative.   Eyes: Negative.   Respiratory: Negative.   Cardiovascular: Negative.   Gastrointestinal: Negative.   Endocrine: Negative.   Genitourinary: Negative.   Musculoskeletal: Negative.   Skin: Negative.   Allergic/Immunologic: Negative.   Neurological: Negative.   Hematological: Negative.   Psychiatric/Behavioral: Negative.     Per HPI unless specifically indicated above     Objective:    BP 119/75   Pulse 65   Ht 5' 11.2" (1.808 m)   Wt 168 lb 8 oz (76.4 kg)   SpO2 100%   BMI 23.37 kg/m   Wt Readings from Last 3 Encounters:  04/22/18 168 lb 8 oz (76.4 kg)  02/11/18 165 lb (74.8 kg)  02/05/18 170 lb 7 oz (77.3 kg)    Physical Exam  Constitutional: He is oriented to person, place, and time. He appears well-developed and well-nourished.  HENT:  Head: Normocephalic.  Right Ear: External ear normal.  Left Ear: External ear normal.  Nose: Nose normal.  Eyes: Pupils are equal, round, and reactive to light. Conjunctivae and EOM are normal.  Neck: Normal range of motion. Neck supple. No thyromegaly present.    Cardiovascular: Normal rate, regular rhythm, normal heart sounds and intact distal pulses.  Pulmonary/Chest: Effort normal and breath sounds normal.  Abdominal: Soft. Bowel sounds are normal. There is no splenomegaly or hepatomegaly.  Genitourinary: Penis normal.  Musculoskeletal: Normal range of motion.  Lymphadenopathy:    He has no cervical adenopathy.  Neurological: He is alert and oriented to person, place, and time. He has normal reflexes.  Skin: Skin is warm and dry.  Psychiatric: He has a normal mood and affect. His behavior is normal. Judgment and thought content normal.    Results for orders placed or performed in visit on 02/11/18  CULTURE, URINE COMPREHENSIVE  Result Value Ref Range   Urine Culture, Comprehensive Final report    Organism ID, Bacteria Comment   Microscopic Examination  Result Value Ref Range   WBC, UA None seen 0 - 5 /hpf   RBC, UA 0-2 0 - 2 /hpf   Epithelial Cells (non renal) None seen 0 - 10 /hpf   Bacteria, UA None seen None seen/Few  Urinalysis, Complete  Result Value Ref Range   Specific Gravity, UA 1.025 1.005 - 1.030   pH, UA 6.0 5.0 - 7.5   Color, UA Yellow Yellow   Appearance Ur Clear Clear   Leukocytes, UA Negative Negative   Protein, UA Negative Negative/Trace   Glucose, UA Negative Negative   Ketones, UA 1+ (A)  Negative   RBC, UA Trace (A) Negative   Bilirubin, UA Negative Negative   Urobilinogen, Ur 0.2 0.2 - 1.0 mg/dL   Nitrite, UA Negative Negative   Microscopic Examination See below:   BLADDER SCAN AMB NON-IMAGING  Result Value Ref Range   Scan Result 1ml       Assessment & Plan:   Problem List Items Addressed This Visit      Other   Hypercholesterolemia   Relevant Orders   Lipid panel    Other Visit Diagnoses    Elevated cholesterol    -  Primary   Relevant Orders   Lipid panel   Elevated liver enzymes       Relevant Orders   Comprehensive metabolic panel   Thyroid disorder screen       Relevant Orders   TSH    Prostate cancer screening       Screening for diabetes mellitus (DM)       Relevant Orders   CBC with Differential/Platelet   Comprehensive metabolic panel   Screening cholesterol level       Needs flu shot       Relevant Orders   Flu Vaccine QUAD 6+ mos PF IM (Fluarix Quad PF)   PE (physical exam), annual           Follow up plan: Return in about 1 year (around 04/23/2019).

## 2018-04-23 ENCOUNTER — Encounter: Payer: Self-pay | Admitting: Family Medicine

## 2018-04-23 LAB — CBC WITH DIFFERENTIAL/PLATELET
BASOS ABS: 0.1 10*3/uL (ref 0.0–0.2)
Basos: 1 %
EOS (ABSOLUTE): 0.2 10*3/uL (ref 0.0–0.4)
Eos: 5 %
Hematocrit: 46.2 % (ref 37.5–51.0)
Hemoglobin: 15.7 g/dL (ref 13.0–17.7)
Immature Grans (Abs): 0 10*3/uL (ref 0.0–0.1)
Immature Granulocytes: 0 %
LYMPHS ABS: 1.5 10*3/uL (ref 0.7–3.1)
Lymphs: 28 %
MCH: 29.3 pg (ref 26.6–33.0)
MCHC: 34 g/dL (ref 31.5–35.7)
MCV: 86 fL (ref 79–97)
MONOS ABS: 0.5 10*3/uL (ref 0.1–0.9)
Monocytes: 10 %
Neutrophils Absolute: 2.9 10*3/uL (ref 1.4–7.0)
Neutrophils: 56 %
Platelets: 213 10*3/uL (ref 150–450)
RBC: 5.36 x10E6/uL (ref 4.14–5.80)
RDW: 12.4 % (ref 12.3–15.4)
WBC: 5.3 10*3/uL (ref 3.4–10.8)

## 2018-04-23 LAB — COMPREHENSIVE METABOLIC PANEL
ALK PHOS: 59 IU/L (ref 39–117)
ALT: 21 IU/L (ref 0–44)
AST: 21 IU/L (ref 0–40)
Albumin/Globulin Ratio: 2 (ref 1.2–2.2)
Albumin: 4.5 g/dL (ref 3.5–5.5)
BILIRUBIN TOTAL: 0.4 mg/dL (ref 0.0–1.2)
BUN/Creatinine Ratio: 10 (ref 9–20)
BUN: 10 mg/dL (ref 6–24)
CHLORIDE: 101 mmol/L (ref 96–106)
CO2: 24 mmol/L (ref 20–29)
CREATININE: 1.01 mg/dL (ref 0.76–1.27)
Calcium: 8.8 mg/dL (ref 8.7–10.2)
GFR calc Af Amer: 106 mL/min/{1.73_m2} (ref >59)
GFR calc non Af Amer: 92 mL/min/{1.73_m2} (ref >59)
GLOBULIN, TOTAL: 2.3 g/dL (ref 1.5–4.5)
GLUCOSE: 82 mg/dL (ref 65–99)
Potassium: 4.4 mmol/L (ref 3.5–5.2)
SODIUM: 138 mmol/L (ref 134–144)
Total Protein: 6.8 g/dL (ref 6.0–8.5)

## 2018-04-23 LAB — LIPID PANEL
CHOLESTEROL TOTAL: 157 mg/dL (ref 100–199)
Chol/HDL Ratio: 4.4 ratio (ref 0.0–5.0)
HDL: 36 mg/dL — ABNORMAL LOW (ref >39)
LDL CALC: 105 mg/dL — AB (ref 0–99)
TRIGLYCERIDES: 79 mg/dL (ref 0–149)
VLDL Cholesterol Cal: 16 mg/dL (ref 5–40)

## 2018-04-23 LAB — TSH: TSH: 2.29 u[IU]/mL (ref 0.450–4.500)

## 2018-10-08 ENCOUNTER — Ambulatory Visit: Payer: 59 | Admitting: Family Medicine

## 2018-10-08 ENCOUNTER — Encounter: Payer: Self-pay | Admitting: Family Medicine

## 2018-10-08 VITALS — BP 112/77 | HR 82 | Temp 97.9°F | Ht 71.2 in | Wt 169.0 lb

## 2018-10-08 DIAGNOSIS — M778 Other enthesopathies, not elsewhere classified: Secondary | ICD-10-CM | POA: Diagnosis not present

## 2018-10-08 MED ORDER — CYCLOBENZAPRINE HCL 5 MG PO TABS: 5.0000 mg | ORAL_TABLET | Freq: Every evening | ORAL | 0 refills | Status: DC | PRN

## 2018-10-08 MED ORDER — DICLOFENAC SODIUM 1 % TD GEL: 2.0000 g | Freq: Four times a day (QID) | TRANSDERMAL | 2 refills | Status: DC

## 2018-10-08 NOTE — Progress Notes (Signed)
BP 112/77   Pulse 82   Temp 97.9 F (36.6 C) (Temporal)   Ht 5' 11.2" (1.808 m)   Wt 169 lb (76.7 kg)   SpO2 100%   BMI 23.44 kg/m    Subjective:    Patient ID: Brian Cox, male    DOB: [birth year], 43 y.o.   MRN: [id number]  HPI: Brian Cox is a 43 y.o. male  Chief Complaint  Patient presents with  . Wrist Pain    left arm. Possible tendionitis in arm. Pt is a pianist. worse w/ use, better with rest. Started going on x 2 weeks.     Here today with left wrist pain the past few weeks. Seems to flare with movement. 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 States he's prone to tendonitis when he plays like that. Trying OTC pain relievers with minimal relief. No swelling, redness, numbness, tingling.   Relevant past medical, surgical, family and social history reviewed and updated as indicated. Interim medical history since our last visit reviewed. Allergies and medications reviewed and updated.  Review of Systems  Per HPI unless specifically indicated above     Objective:    BP 112/77   Pulse 82   Temp 97.9 F (36.6 C) (Temporal)   Ht 5' 11.2" (1.808 m)   Wt 169 lb (76.7 kg)   SpO2 100%   BMI 23.44 kg/m   Wt Readings from Last 3 Encounters:  10/08/18 169 lb (76.7 kg)  04/22/18 168 lb 8 oz (76.4 kg)  02/11/18 165 lb (74.8 kg)    Physical Exam Vitals signs and nursing note reviewed.  Constitutional:      Appearance: Normal appearance.  HENT:     Head: Atraumatic.  Eyes:     Extraocular Movements: Extraocular movements intact.     Conjunctiva/sclera: Conjunctivae normal.  Neck:     Musculoskeletal: Normal range of motion and neck supple.  Cardiovascular:     Rate and Rhythm: Normal rate and regular rhythm.  Pulmonary:     Effort: Pulmonary effort is normal.     Breath sounds: Normal breath sounds.  Musculoskeletal: Normal range of motion.        General: Tenderness (minimal ttp left forearm) present. No swelling.    Skin:    General: Skin is warm and dry.  Neurological:     General: No focal deficit present.     Mental Status: He is oriented to person, place, and time.     Sensory: No sensory deficit.     Motor: No weakness.  Psychiatric:        Mood and Affect: Mood normal.        Thought Content: Thought content normal.        Judgment: Judgment normal.     Results for orders placed or performed in visit on 04/22/18  CBC with Differential/Platelet  Result Value Ref Range   WBC 5.3 3.4 - 10.8 x10E3/uL   RBC 5.36 4.14 - 5.80 x10E6/uL   Hemoglobin 15.7 13.0 - 17.7 g/dL   Hematocrit 46.2 37.5 - 51.0 %   MCV 86 79 - 97 fL   MCH 29.3 26.6 - 33.0 pg   MCHC 34.0 31.5 - 35.7 g/dL   RDW 12.4 12.3 - 15.4 %   Platelets 213 150 - 450 x10E3/uL   Neutrophils 56 Not Estab. %   Lymphs 28 Not Estab. %   Monocytes 10 Not Estab. %   Eos 5 Not  Estab. %   Basos 1 Not Estab. %   Neutrophils Absolute 2.9 1.4 - 7.0 x10E3/uL   Lymphocytes Absolute 1.5 0.7 - 3.1 x10E3/uL   Monocytes Absolute 0.5 0.1 - 0.9 x10E3/uL   EOS (ABSOLUTE) 0.2 0.0 - 0.4 x10E3/uL   Basophils Absolute 0.1 0.0 - 0.2 x10E3/uL   Immature Granulocytes 0 Not Estab. %   Immature Grans (Abs) 0.0 0.0 - 0.1 x10E3/uL  Comprehensive metabolic panel  Result Value Ref Range   Glucose 82 65 - 99 mg/dL   BUN 10 6 - 24 mg/dL   Creatinine, Ser 1.01 0.76 - 1.27 mg/dL   GFR calc non Af Amer 92 >59 mL/min/1.73   GFR calc Af Amer 106 >59 mL/min/1.73   BUN/Creatinine Ratio 10 9 - 20   Sodium 138 134 - 144 mmol/L   Potassium 4.4 3.5 - 5.2 mmol/L   Chloride 101 96 - 106 mmol/L   CO2 24 20 - 29 mmol/L   Calcium 8.8 8.7 - 10.2 mg/dL   Total Protein 6.8 6.0 - 8.5 g/dL   Albumin 4.5 3.5 - 5.5 g/dL   Globulin, Total 2.3 1.5 - 4.5 g/dL   Albumin/Globulin Ratio 2.0 1.2 - 2.2   Bilirubin Total 0.4 0.0 - 1.2 mg/dL   Alkaline Phosphatase 59 39 - 117 IU/L   AST 21 0 - 40 IU/L   ALT 21 0 - 44 IU/L  Lipid panel  Result Value Ref Range   Cholesterol,  Total 157 100 - 199 mg/dL   Triglycerides 79 0 - 149 mg/dL   HDL 36 (L) >39 mg/dL   VLDL Cholesterol Cal 16 5 - 40 mg/dL   LDL Calculated 105 (H) 0 - 99 mg/dL   Chol/HDL Ratio 4.4 0.0 - 5.0 ratio  TSH  Result Value Ref Range   TSH 2.290 0.450 - 4.500 uIU/mL      Assessment & Plan:   Problem List Items Addressed This Visit    None    Visit Diagnoses    Left wrist tendinitis    -  Primary   Tx with diclofenac gel, flexeril, ice/heat, stretches, rest. Can wear brace while playing. F/u if not improving       Follow up plan: Return if symptoms worsen or fail to improve.

## 2018-10-21 DIAGNOSIS — M7712 Lateral epicondylitis, left elbow: Secondary | ICD-10-CM | POA: Diagnosis not present

## 2018-12-02 DIAGNOSIS — M7712 Lateral epicondylitis, left elbow: Secondary | ICD-10-CM | POA: Diagnosis not present

## 2018-12-30 DIAGNOSIS — M7712 Lateral epicondylitis, left elbow: Secondary | ICD-10-CM | POA: Diagnosis not present

## 2019-04-28 ENCOUNTER — Encounter: Payer: 59 | Admitting: Family Medicine

## 2019-05-25 ENCOUNTER — Telehealth: Payer: Self-pay | Admitting: Family Medicine

## 2019-05-25 DIAGNOSIS — Z0000 Encounter for general adult medical examination without abnormal findings: Secondary | ICD-10-CM

## 2019-05-25 NOTE — Telephone Encounter (Signed)
Called to go over script screening pt is wanting to have his labs done in am before his physical at 1:00. Please advise

## 2019-05-26 ENCOUNTER — Other Ambulatory Visit: Payer: 59

## 2019-05-26 ENCOUNTER — Encounter: Payer: Self-pay | Admitting: Family Medicine

## 2019-05-26 ENCOUNTER — Other Ambulatory Visit: Payer: Self-pay

## 2019-05-26 ENCOUNTER — Ambulatory Visit (INDEPENDENT_AMBULATORY_CARE_PROVIDER_SITE_OTHER): Payer: 59 | Admitting: Family Medicine

## 2019-05-26 VITALS — BP 117/78 | HR 69 | Temp 98.6°F | Ht 70.0 in | Wt 169.0 lb

## 2019-05-26 DIAGNOSIS — Z0000 Encounter for general adult medical examination without abnormal findings: Secondary | ICD-10-CM

## 2019-05-26 DIAGNOSIS — Z23 Encounter for immunization: Secondary | ICD-10-CM | POA: Diagnosis not present

## 2019-05-26 LAB — MICROSCOPIC EXAMINATION
Bacteria, UA: NONE SEEN
WBC, UA: NONE SEEN /hpf (ref 0–5)

## 2019-05-26 LAB — URINALYSIS, ROUTINE W REFLEX MICROSCOPIC
Bilirubin, UA: NEGATIVE
Glucose, UA: NEGATIVE
Ketones, UA: NEGATIVE
Leukocytes,UA: NEGATIVE
Nitrite, UA: NEGATIVE
Protein,UA: NEGATIVE
Specific Gravity, UA: 1.02 (ref 1.005–1.030)
Urobilinogen, Ur: 0.2 mg/dL (ref 0.2–1.0)
pH, UA: 7 (ref 5.0–7.5)

## 2019-05-26 NOTE — Telephone Encounter (Signed)
done

## 2019-05-26 NOTE — Progress Notes (Signed)
BP 117/78   Pulse 69   Temp 98.6 F (37 C) (Oral)   Ht '5\' 10"'  (1.778 m)   Wt 169 lb (76.7 kg)   SpO2 96%   BMI 24.25 kg/m    Subjective:    Patient ID: Brian Cox, male    DOB: 01-14-1976, 43 y.o.   MRN: [id number]  HPI: Brian Cox is a 43 y.o. male presenting on 05/26/2019 for comprehensive medical examination. Current medical complaints include:none  Interim Problems from his last visit: no  Depression Screen done today and results listed below:  Depression screen [facility] 2/9 05/26/2019 04/22/2018 04/01/2017 03/29/2016  Decreased Interest 0 0 0 0  Down, Depressed, Hopeless 0 0 0 0  PHQ - 2 Score 0 0 0 0  Altered sleeping 0 1 - -  Tired, decreased energy 0 0 - -  Change in appetite 0 0 - -  Feeling bad or failure about yourself  0 0 - -  Trouble concentrating 0 0 - -  Moving slowly or fidgety/restless 0 0 - -  Suicidal thoughts 0 0 - -  PHQ-9 Score 0 1 - -  Difficult doing work/chores Not difficult at all - - -    Past Medical History:  History reviewed. No pertinent past medical history.  Surgical History:  History reviewed. No pertinent surgical history.  Medications:  Current Outpatient Medications on File Prior to Visit  Medication Sig  . Cholecalciferol (VITAMIN D3) 5000 UNITS CAPS Take 5,000 Units by mouth daily.   No current facility-administered medications on file prior to visit.     Allergies:  Allergies  Allergen Reactions  . Ciprofloxacin Hcl Swelling  . Donnatal  [Phenobarbital-Belladonna Alk] Swelling  . Levofloxacin Swelling    Social History:  Social History   Socioeconomic History  . Marital status: Single    Spouse name: Not on file  . Number of children: Not on file  . Years of education: Not on file  . Highest education level: Not on file  Occupational History  . Not on file  Social Needs  . Financial resource strain: Not on file  . Food insecurity    Worry: Not on file    Inability: Not on file  . Transportation needs     Medical: Not on file    Non-medical: Not on file  Tobacco Use  . Smoking status: Never Smoker  . Smokeless tobacco: Never Used  Substance and Sexual Activity  . Alcohol use: No  . Drug use: No  . Sexual activity: Not on file  Lifestyle  . Physical activity    Days per week: Not on file    Minutes per session: Not on file  . Stress: Not on file  Relationships  . Social Herbalist on phone: Not on file    Gets together: Not on file    Attends religious service: Not on file    Active member of club or organization: Not on file    Attends meetings of clubs or organizations: Not on file    Relationship status: Not on file  . Intimate partner violence    Fear of current or ex partner: Not on file    Emotionally abused: Not on file    Physically abused: Not on file    Forced sexual activity: Not on file  Other Topics Concern  . Not on file  Social History Narrative  . Not on file   Social History   Tobacco Use  Smoking Status Never Smoker  Smokeless Tobacco Never Used   Social History   Substance and Sexual Activity  Alcohol Use No    Family History:  Family History  Problem Relation Age of Onset  . Hyperlipidemia Mother   . Hypertension Father   . Aneurysm Maternal Grandmother   . Heart disease Maternal Grandfather     Past medical history, surgical history, medications, allergies, family history and social history reviewed with patient today and changes made to appropriate areas of the chart.   Review of Systems  Constitutional: Negative.   HENT: Negative.        Lump on his neck  Bump in R nostril   Eyes: Negative.   Respiratory: Negative.   Cardiovascular: Negative.   Gastrointestinal: Negative.   Genitourinary: Negative.   Musculoskeletal: Negative.   Skin: Negative.   Neurological: Negative.   Endo/Heme/Allergies: Negative.   Psychiatric/Behavioral: Negative.     All other ROS negative except what is listed above and in the HPI.       Objective:    BP 117/78   Pulse 69   Temp 98.6 F (37 C) (Oral)   Ht '5\' 10"'  (1.778 m)   Wt 169 lb (76.7 kg)   SpO2 96%   BMI 24.25 kg/m   Wt Readings from Last 3 Encounters:  05/26/19 169 lb (76.7 kg)  10/08/18 169 lb (76.7 kg)  04/22/18 168 lb 8 oz (76.4 kg)    Physical Exam Vitals signs and nursing note reviewed.  Constitutional:      General: He is not in acute distress.    Appearance: Normal appearance. He is not ill-appearing, toxic-appearing or diaphoretic.  HENT:     Head: Normocephalic and atraumatic.     Right Ear: Tympanic membrane, ear canal and external ear normal. There is no impacted cerumen.     Left Ear: Tympanic membrane, ear canal and external ear normal. There is no impacted cerumen.     Nose: Nose normal. No congestion or rhinorrhea.     Comments: Small bump in R nostril- no redness, no swelling    Mouth/Throat:     Mouth: Mucous membranes are moist.     Pharynx: Oropharynx is clear. No oropharyngeal exudate or posterior oropharyngeal erythema.  Eyes:     General: No scleral icterus.       Right eye: No discharge.        Left eye: No discharge.     Extraocular Movements: Extraocular movements intact.     Conjunctiva/sclera: Conjunctivae normal.     Pupils: Pupils are equal, round, and reactive to light.  Neck:     Musculoskeletal: Normal range of motion and neck supple. No neck rigidity or muscular tenderness.     Vascular: No carotid bruit.  Cardiovascular:     Rate and Rhythm: Normal rate and regular rhythm.     Pulses: Normal pulses.     Heart sounds: No murmur. No friction rub. No gallop.   Pulmonary:     Effort: Pulmonary effort is normal. No respiratory distress.     Breath sounds: Normal breath sounds. No stridor. No wheezing, rhonchi or rales.  Chest:     Chest wall: No tenderness.  Abdominal:     General: Abdomen is flat. Bowel sounds are normal. There is no distension.     Palpations: Abdomen is soft. There is no mass.      Tenderness: There is no abdominal tenderness. There is no right CVA tenderness, left CVA tenderness,  guarding or rebound.     Hernia: No hernia is present.  Genitourinary:    Comments: Genital exam deferred with shared decision making Musculoskeletal:        General: No swelling, tenderness, deformity or signs of injury.     Right lower leg: No edema.     Left lower leg: No edema.  Lymphadenopathy:     Cervical: No cervical adenopathy.  Skin:    General: Skin is warm and dry.     Capillary Refill: Capillary refill takes less than 2 seconds.     Coloration: Skin is not jaundiced or pale.     Findings: No bruising, erythema, lesion or rash.     Comments: 1 inch lipoma on L posterior neck  Neurological:     General: No focal deficit present.     Mental Status: He is alert and oriented to person, place, and time.     Cranial Nerves: No cranial nerve deficit.     Sensory: No sensory deficit.     Motor: No weakness.     Coordination: Coordination normal.     Gait: Gait normal.     Deep Tendon Reflexes: Reflexes normal.  Psychiatric:        Mood and Affect: Mood normal.        Behavior: Behavior normal.        Thought Content: Thought content normal.        Judgment: Judgment normal.     Results for orders placed or performed in visit on 04/22/18  CBC with Differential/Platelet  Result Value Ref Range   WBC 5.3 3.4 - 10.8 x10E3/uL   RBC 5.36 4.14 - 5.80 x10E6/uL   Hemoglobin 15.7 13.0 - 17.7 g/dL   Hematocrit 46.2 37.5 - 51.0 %   MCV 86 79 - 97 fL   MCH 29.3 26.6 - 33.0 pg   MCHC 34.0 31.5 - 35.7 g/dL   RDW 12.4 12.3 - 15.4 %   Platelets 213 150 - 450 x10E3/uL   Neutrophils 56 Not Estab. %   Lymphs 28 Not Estab. %   Monocytes 10 Not Estab. %   Eos 5 Not Estab. %   Basos 1 Not Estab. %   Neutrophils Absolute 2.9 1.4 - 7.0 x10E3/uL   Lymphocytes Absolute 1.5 0.7 - 3.1 x10E3/uL   Monocytes Absolute 0.5 0.1 - 0.9 x10E3/uL   EOS (ABSOLUTE) 0.2 0.0 - 0.4 x10E3/uL   Basophils  Absolute 0.1 0.0 - 0.2 x10E3/uL   Immature Granulocytes 0 Not Estab. %   Immature Grans (Abs) 0.0 0.0 - 0.1 x10E3/uL  Comprehensive metabolic panel  Result Value Ref Range   Glucose 82 65 - 99 mg/dL   BUN 10 6 - 24 mg/dL   Creatinine, Ser 1.01 0.76 - 1.27 mg/dL   GFR calc non Af Amer 92 >59 mL/min/1.73   GFR calc Af Amer 106 >59 mL/min/1.73   BUN/Creatinine Ratio 10 9 - 20   Sodium 138 134 - 144 mmol/L   Potassium 4.4 3.5 - 5.2 mmol/L   Chloride 101 96 - 106 mmol/L   CO2 24 20 - 29 mmol/L   Calcium 8.8 8.7 - 10.2 mg/dL   Total Protein 6.8 6.0 - 8.5 g/dL   Albumin 4.5 3.5 - 5.5 g/dL   Globulin, Total 2.3 1.5 - 4.5 g/dL   Albumin/Globulin Ratio 2.0 1.2 - 2.2   Bilirubin Total 0.4 0.0 - 1.2 mg/dL   Alkaline Phosphatase 59 39 - 117 IU/L   AST 21  0 - 40 IU/L   ALT 21 0 - 44 IU/L  Lipid panel  Result Value Ref Range   Cholesterol, Total 157 100 - 199 mg/dL   Triglycerides 79 0 - 149 mg/dL   HDL 36 (L) >39 mg/dL   VLDL Cholesterol Cal 16 5 - 40 mg/dL   LDL Calculated 105 (H) 0 - 99 mg/dL   Chol/HDL Ratio 4.4 0.0 - 5.0 ratio  TSH  Result Value Ref Range   TSH 2.290 0.450 - 4.500 uIU/mL      Assessment & Plan:   Problem List Items Addressed This Visit    None    Visit Diagnoses    Routine general medical examination at a health care facility    -  Primary   Vaccines up to date. Screening labs checked today. Continue diet and exercise. Call with any concerns.    Flu vaccine need       Flu shot given today.    Relevant Orders   Flu Vaccine QUAD 36+ mos IM (Completed)       LABORATORY TESTING:  Health maintenance labs ordered today as discussed above.   IMMUNIZATIONS:   - Tdap: Tetanus vaccination status reviewed: last tetanus booster within 10 years. - Influenza: Administered today - Pneumovax: Not applicable  PATIENT COUNSELING:    Sexuality: Discussed sexually transmitted diseases, partner selection, use of condoms, avoidance of unintended pregnancy  and  contraceptive alternatives.   Advised to avoid cigarette smoking.  I discussed with the patient that most people either abstain from alcohol or drink within safe limits (<=14/week and <=4 drinks/occasion for males, <=7/weeks and <= 3 drinks/occasion for females) and that the risk for alcohol disorders and other health effects rises proportionally with the number of drinks per week and how often a drinker exceeds daily limits.  Discussed cessation/primary prevention of drug use and availability of treatment for abuse.   Diet: Encouraged to adjust caloric intake to maintain  or achieve ideal body weight, to reduce intake of dietary saturated fat and total fat, to limit sodium intake by avoiding high sodium foods and not adding table salt, and to maintain adequate dietary potassium and calcium preferably from fresh fruits, vegetables, and low-fat dairy products.    stressed the importance of regular exercise  Injury prevention: Discussed safety belts, safety helmets, smoke detector, smoking near bedding or upholstery.   Dental health: Discussed importance of regular tooth brushing, flossing, and dental visits.   Follow up plan: NEXT PREVENTATIVE PHYSICAL DUE IN 1 YEAR. Return in about 1 year (around 05/25/2020) for Physical.

## 2019-05-27 ENCOUNTER — Encounter: Payer: Self-pay | Admitting: Family Medicine

## 2019-05-27 LAB — LIPID PANEL
Chol/HDL Ratio: 4.3 ratio (ref 0.0–5.0)
Cholesterol, Total: 168 mg/dL (ref 100–199)
HDL: 39 mg/dL — ABNORMAL LOW (ref >39)
LDL Chol Calc (NIH): 116 mg/dL — ABNORMAL HIGH (ref 0–99)
Triglycerides: 68 mg/dL (ref 0–149)
VLDL Cholesterol Cal: 13 mg/dL (ref 5–40)

## 2019-05-27 LAB — CBC WITH DIFFERENTIAL/PLATELET
Basophils Absolute: 0.1 10*3/uL (ref 0.0–0.2)
Basos: 2 %
EOS (ABSOLUTE): 0.2 10*3/uL (ref 0.0–0.4)
Eos: 4 %
Hematocrit: 43.8 % (ref 37.5–51.0)
Hemoglobin: 15.4 g/dL (ref 13.0–17.7)
Immature Grans (Abs): 0 10*3/uL (ref 0.0–0.1)
Immature Granulocytes: 0 %
Lymphocytes Absolute: 1.2 10*3/uL (ref 0.7–3.1)
Lymphs: 26 %
MCH: 29.4 pg (ref 26.6–33.0)
MCHC: 35.2 g/dL (ref 31.5–35.7)
MCV: 84 fL (ref 79–97)
Monocytes Absolute: 0.5 10*3/uL (ref 0.1–0.9)
Monocytes: 11 %
Neutrophils Absolute: 2.7 10*3/uL (ref 1.4–7.0)
Neutrophils: 57 %
Platelets: 189 10*3/uL (ref 150–450)
RBC: 5.23 x10E6/uL (ref 4.14–5.80)
RDW: 12.7 % (ref 11.6–15.4)
WBC: 4.7 10*3/uL (ref 3.4–10.8)

## 2019-05-27 LAB — COMPREHENSIVE METABOLIC PANEL
ALT: 30 IU/L (ref 0–44)
AST: 25 IU/L (ref 0–40)
Albumin/Globulin Ratio: 1.6 (ref 1.2–2.2)
Albumin: 4.4 g/dL (ref 4.0–5.0)
Alkaline Phosphatase: 69 IU/L (ref 39–117)
BUN/Creatinine Ratio: 7 — ABNORMAL LOW (ref 9–20)
BUN: 7 mg/dL (ref 6–24)
Bilirubin Total: 0.4 mg/dL (ref 0.0–1.2)
CO2: 23 mmol/L (ref 20–29)
Calcium: 9.1 mg/dL (ref 8.7–10.2)
Chloride: 103 mmol/L (ref 96–106)
Creatinine, Ser: 1 mg/dL (ref 0.76–1.27)
GFR calc Af Amer: 107 mL/min/{1.73_m2} (ref >59)
GFR calc non Af Amer: 92 mL/min/{1.73_m2} (ref >59)
Globulin, Total: 2.7 g/dL (ref 1.5–4.5)
Glucose: 90 mg/dL (ref 65–99)
Potassium: 4.6 mmol/L (ref 3.5–5.2)
Sodium: 139 mmol/L (ref 134–144)
Total Protein: 7.1 g/dL (ref 6.0–8.5)

## 2019-05-27 LAB — TSH: TSH: 2.48 u[IU]/mL (ref 0.450–4.500)

## 2019-09-02 ENCOUNTER — Other Ambulatory Visit: Payer: 59

## 2019-09-04 ENCOUNTER — Other Ambulatory Visit: Payer: 59

## 2019-09-28 IMAGING — CT CT ABD-PELV W/O CM
2 of 4 series · 15 of 46 positions shown, 17 images · non-contrast
Comparison: None.

CLINICAL DATA: 42-year-old male with history of flank pain and mid
pelvic pain for 1 month. Severe burning pain today which is
constant.

EXAM:
CT ABDOMEN AND PELVIS WITHOUT CONTRAST
TECHNIQUE: Multidetector CT imaging of the abdomen and pelvis was performed
following the standard protocol without IV contrast.

[Series 2: routine abdomen pelvis without · axial · non-contrast · 0.65mm/px · z∈[-1607,-1162]mm · 12 of 99 slices shown, 14 images (1 of 2)]
[im 5/99  soft-tissue]
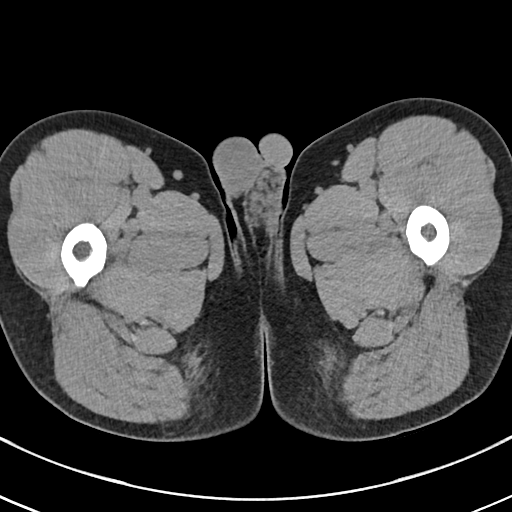
[im 5/99  bone]
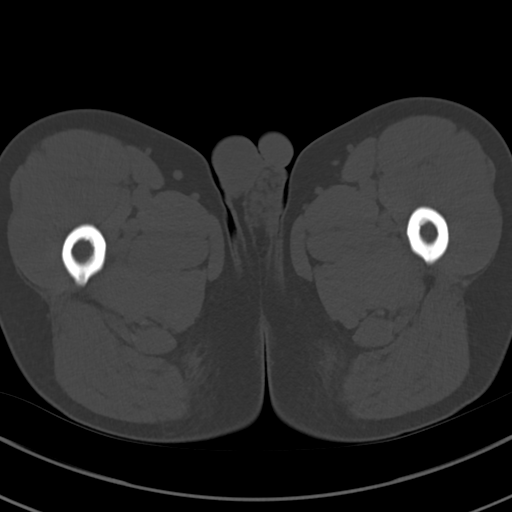
[im 13/99  soft-tissue]
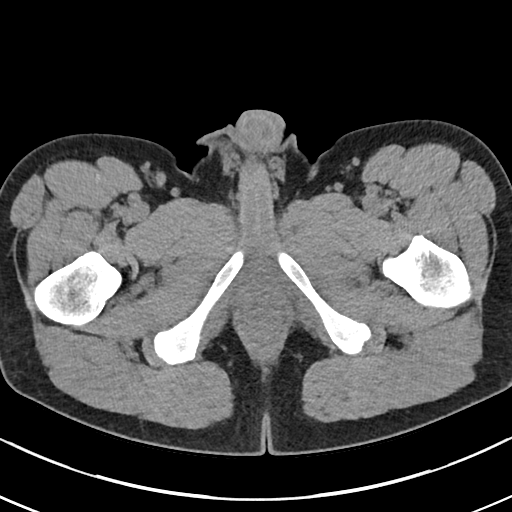
[im 21/99  soft-tissue]
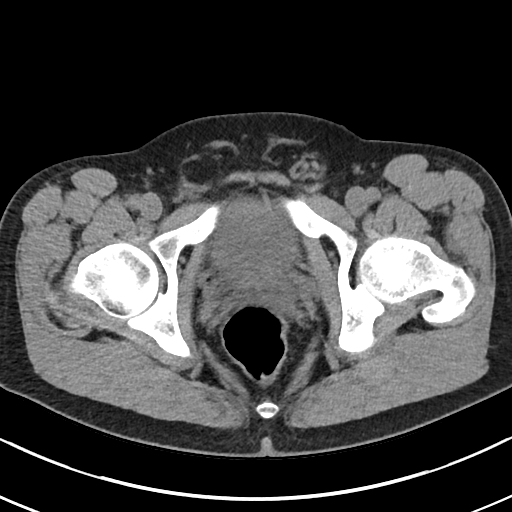
[im 29/99  soft-tissue]
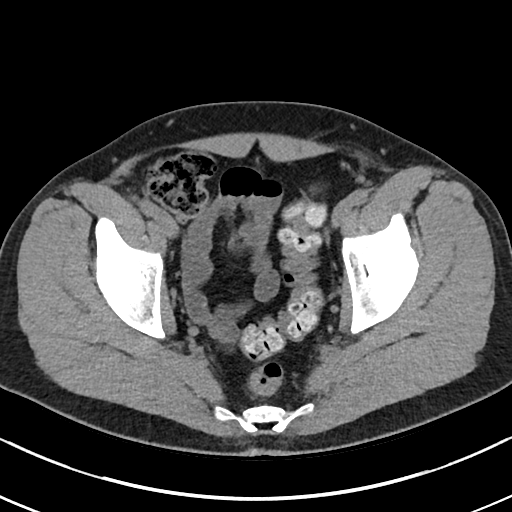
[im 37/99  soft-tissue]
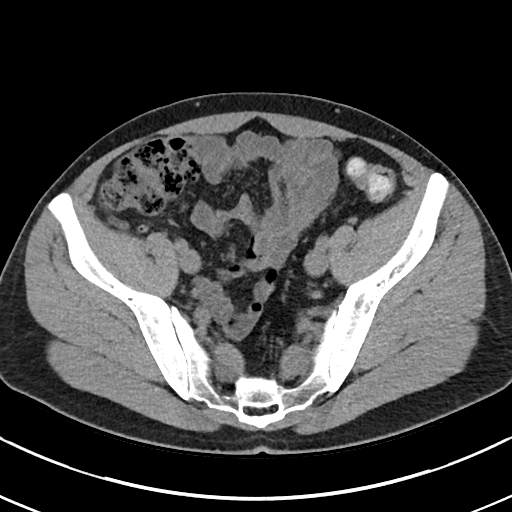
[im 45/99  soft-tissue]
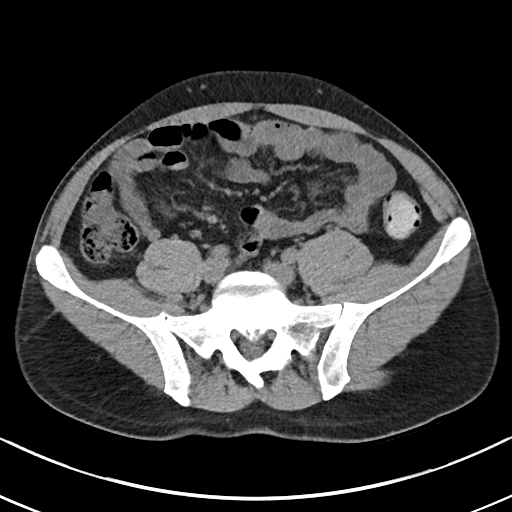
[im 54/99  soft-tissue]
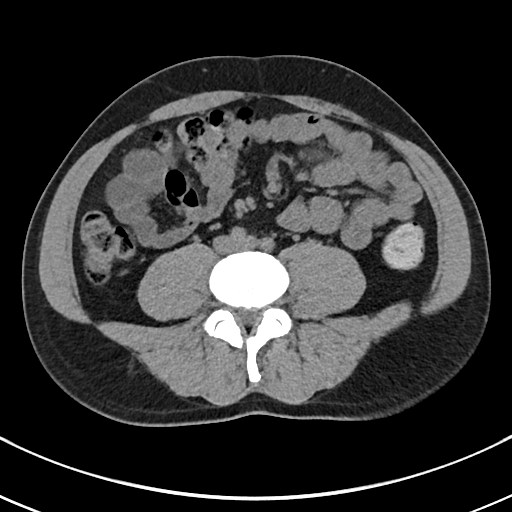
[im 62/99  soft-tissue]
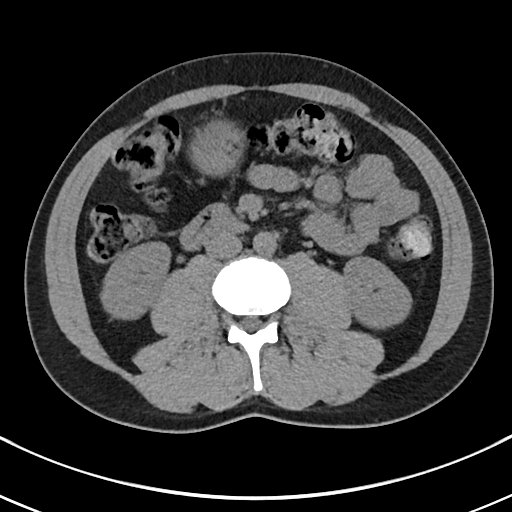
[im 70/99  soft-tissue]
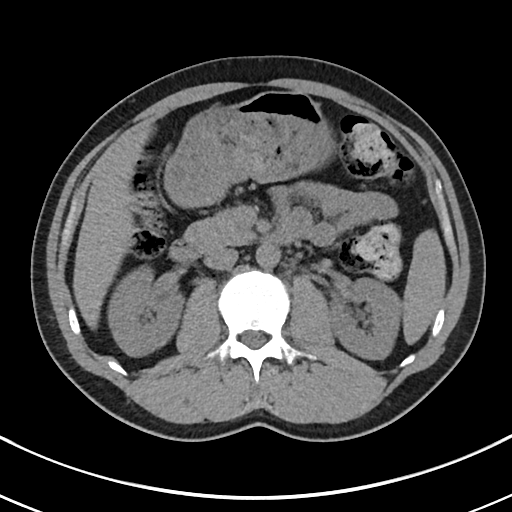
[im 70/99  bone]
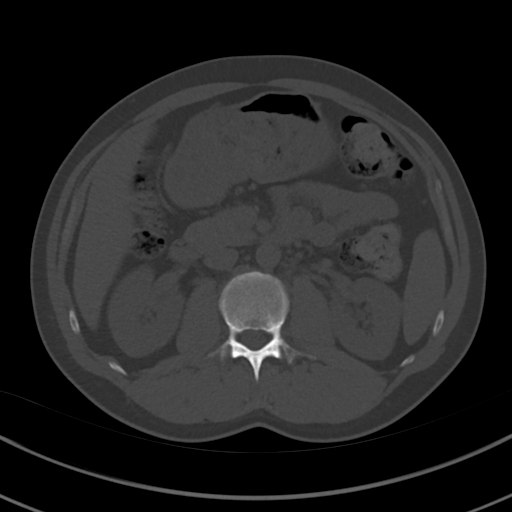
[im 78/99  soft-tissue]
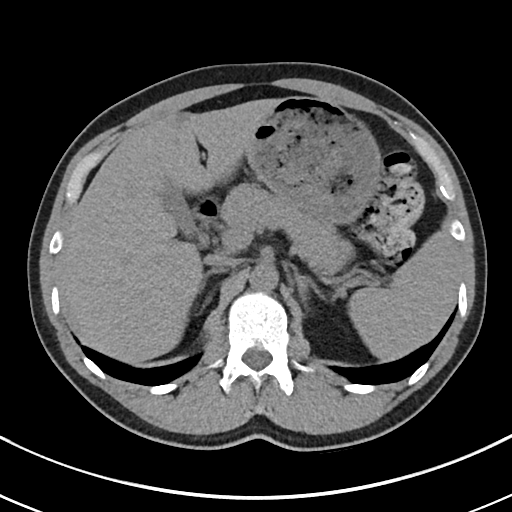
[im 86/99  soft-tissue]
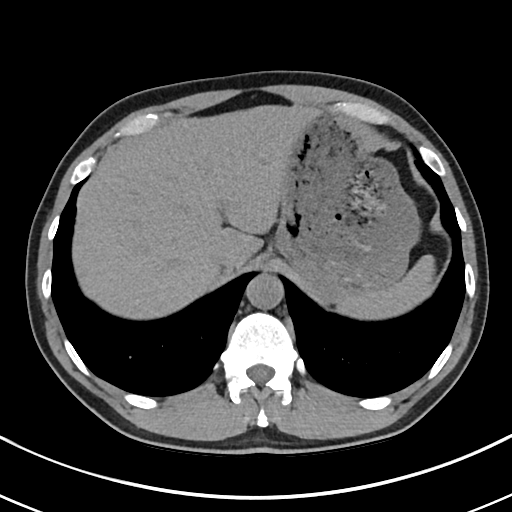
[im 94/99  soft-tissue]
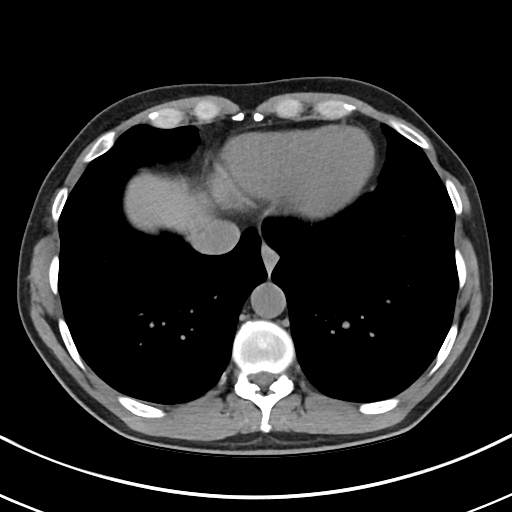

[Series 4: routine abdomen pelvis without · coronal · non-contrast · 0.65mm/px · 3 of 132 slices shown (2 of 2)]
[im 44/132  soft-tissue]
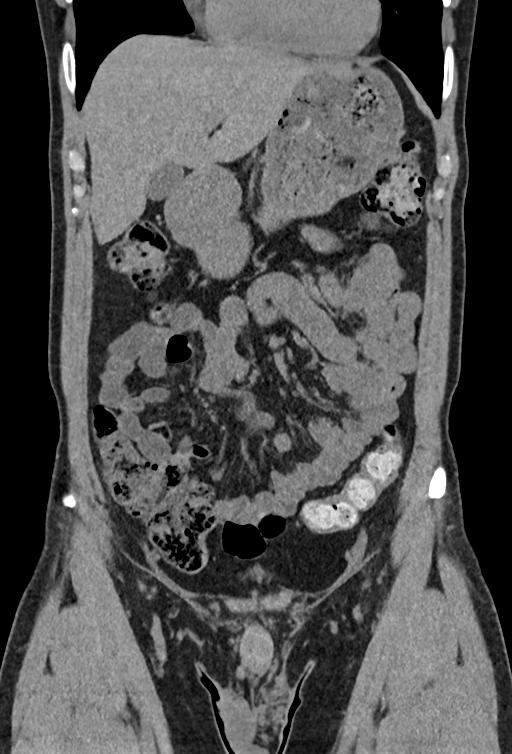
[im 59/132  soft-tissue]
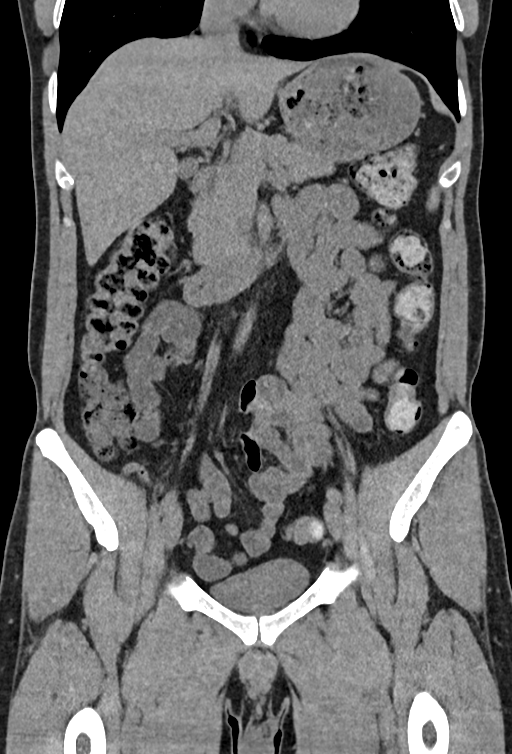
[im 73/132  soft-tissue]
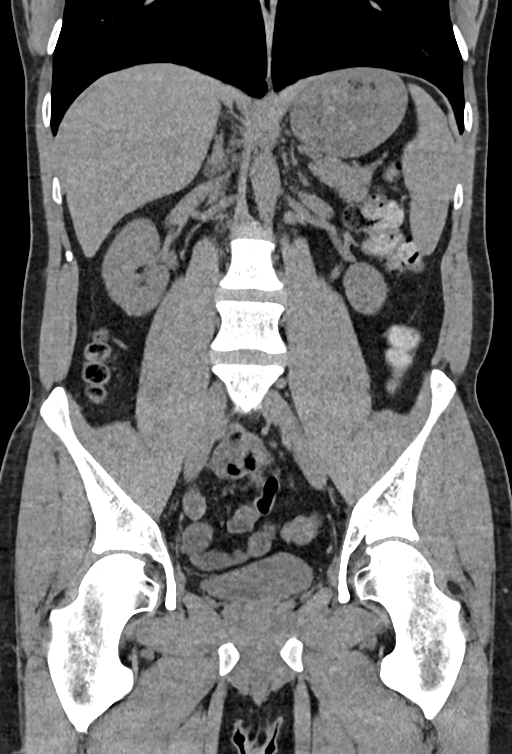

[15 of 46 positions shown; findings below may reference images not displayed]

FINDINGS: Lower chest: Unremarkable.

Hepatobiliary: No definite cystic or solid hepatic lesions are
confidently identified on today's noncontrast CT examination.
Unenhanced appearance of the gallbladder is normal.

Pancreas: No definite pancreatic mass or peripancreatic fluid or
inflammatory changes are noted on today's noncontrast CT
examination.

Spleen: Unremarkable.

Adrenals/Urinary Tract: In the interpolar region of the right kidney
there is a 3 mm nonobstructive calculus. No additional calculi are
identified within the collecting system of the left kidney, along
the course of either ureter, or within the lumen of the urinary
bladder. No hydroureteronephrosis. Unenhanced appearance of the
kidneys and bilateral adrenal glands is normal. Unenhanced
appearance of the urinary bladder is normal.

Stomach/Bowel: Unenhanced appearance of the stomach is normal. There
is no pathologic dilatation of small bowel or colon. Normal
appendix.

Vascular/Lymphatic: No atherosclerotic calcifications are noted in
the abdominal aorta. No lymphadenopathy noted in the abdomen or
pelvis.

Reproductive: Prostate gland and seminal vesicles are unremarkable
in appearance.

Other: No significant volume of ascites.  No pneumoperitoneum.

Musculoskeletal: There are no aggressive appearing lytic or blastic
lesions noted in the visualized portions of the skeleton.
IMPRESSION: 1. 3 mm nonobstructive calculus in the interpolar collecting system
of the right kidney. No ureteral stones or findings of urinary tract
obstruction are noted at this time.
2. No other acute findings are noted in the abdomen or pelvis to
account for the patient's symptoms.

## 2020-03-23 ENCOUNTER — Ambulatory Visit
Admission: EM | Admit: 2020-03-23 | Discharge: 2020-03-23 | Disposition: A | Discharge status: Home / Self Care | Payer: 59 | Attending: Emergency Medicine | Admitting: Emergency Medicine

## 2020-03-23 DIAGNOSIS — T07XXXA Unspecified multiple injuries, initial encounter: Secondary | ICD-10-CM

## 2020-03-23 DIAGNOSIS — Z23 Encounter for immunization: Secondary | ICD-10-CM | POA: Diagnosis not present

## 2020-03-23 MED ORDER — TETANUS-DIPHTH-ACELL PERTUSSIS 5-2.5-18.5 LF-MCG/0.5 IM SUSP
0.5000 mL | Freq: Once | INTRAMUSCULAR | Status: AC
  Administered 2020-03-23: 0.5 mL via INTRAMUSCULAR

## 2020-03-23 NOTE — Discharge Instructions (Signed)
Your tetanus was updated today.    Keep your wounds clean and dry.  Wash them gently twice a day with soap and water.  Apply an antibiotic cream and bandage twice a day.    Follow up with your primary care provider or return here if you see signs of infection, such as increased pain, redness, pus-like drainage, warmth, fever, chills, or other concerning symptoms.

## 2020-03-23 NOTE — ED Provider Notes (Signed)
Brian Cox    CSN: [id number] Arrival date & time: 03/23/20  9476      History   Chief Complaint Chief Complaint  Patient presents with  . Fall    HPI Brian Cox is a 44 y.o. male.   Patient presents with multiple abrasions after he fell while running this morning.  He states he tripped and fell on uneven pavement.  He denies loss of consciousness.  He went home, showered, and applied Neosporin to the abrasions but wants to be checked over.  He denies headache, dizziness, numbness, weakness, chest pain, shortness of breath, abdominal pain, focal joint or bone pain, or other symptoms.  Last tetanus unknown.  The history is provided by the patient.    History reviewed. No pertinent past medical history.  Patient Active Problem List   Diagnosis Date Noted  . Hypercholesterolemia 09/25/2017  . Lipoma of neck 09/25/2017    History reviewed. No pertinent surgical history.     Home Medications    Prior to Admission medications   Medication Sig Start Date End Date Taking? Authorizing Provider  Cholecalciferol (VITAMIN D3) 5000 UNITS CAPS Take 5,000 Units by mouth daily.    [provider]    Family History Family History  Problem Relation Age of Onset  . Hyperlipidemia Mother   . Hypertension Father   . Aneurysm Maternal Grandmother   . Heart disease Maternal Grandfather     Social History Social History   Tobacco Use  . Smoking status: Never Smoker  . Smokeless tobacco: Never Used  Substance Use Topics  . Alcohol use: No  . Drug use: No     Allergies   Ciprofloxacin hcl, Donnatal  [phenobarbital-belladonna alk], and Levofloxacin   Review of Systems Review of Systems  Constitutional: Negative for chills and fever.  HENT: Negative for ear discharge, ear pain and sore throat.   Eyes: Negative for pain and visual disturbance.  Respiratory: Negative for cough and shortness of breath.   Cardiovascular: Negative for chest pain and  palpitations.  Gastrointestinal: Negative for abdominal pain, nausea and vomiting.  Genitourinary: Negative for dysuria and hematuria.  Musculoskeletal: Negative for arthralgias and back pain.  Skin: Positive for wound. Negative for color change.  Neurological: Negative for dizziness, seizures, syncope, weakness, numbness and headaches.  All other systems reviewed and are negative.    Physical Exam Triage Vital Signs ED Triage Vitals  Enc Vitals Group     BP      Pulse      Resp      Temp      Temp src      SpO2      Weight      Height      Head Circumference      Peak Flow      Pain Score      Pain Loc      Pain Edu?      Excl. in [place]?    No data found.  Updated Vital Signs BP 127/88   Pulse 84   Temp 98.3 F (36.8 C)   Resp 16   SpO2 98%   Visual Acuity Right Eye Distance:   Left Eye Distance:   Bilateral Distance:    Right Eye Near:   Left Eye Near:    Bilateral Near:     Physical Exam Vitals and nursing note reviewed.  Constitutional:      General: He is not in acute distress.    Appearance:  He is well-developed. He is not ill-appearing.  HENT:     Head: Normocephalic and atraumatic.     Right Ear: Tympanic membrane normal.     Left Ear: Tympanic membrane normal.     Nose: Nose normal.     Mouth/Throat:     Mouth: Mucous membranes are moist.     Pharynx: Oropharynx is clear.  Eyes:     Extraocular Movements: Extraocular movements intact.     Conjunctiva/sclera: Conjunctivae normal.     Pupils: Pupils are equal, round, and reactive to light.  Cardiovascular:     Rate and Rhythm: Normal rate and regular rhythm.     Heart sounds: Normal heart sounds. No murmur heard.   Pulmonary:     Effort: Pulmonary effort is normal. No respiratory distress.     Breath sounds: Normal breath sounds. No wheezing or rhonchi.  Abdominal:     Palpations: Abdomen is soft.     Tenderness: There is no abdominal tenderness. There is no guarding or rebound.    Musculoskeletal:        General: No swelling or deformity. Normal range of motion.     Cervical back: Neck supple.  Skin:    General: Skin is warm and dry.     Capillary Refill: Capillary refill takes less than 2 seconds.     Findings: Lesion present. No bruising.     Comments: Abrasions on chin, bilateral palms, bilateral knees.  No active bleeding.   Neurological:     General: No focal deficit present.     Mental Status: He is alert and oriented to person, place, and time.     Cranial Nerves: No cranial nerve deficit.     Sensory: No sensory deficit.     Motor: No weakness.     Coordination: Coordination normal.     Gait: Gait normal.  Psychiatric:        Mood and Affect: Mood normal.        Behavior: Behavior normal.      UC Treatments / Results  Labs (all labs ordered are listed, but only abnormal results are displayed) Labs Reviewed - No data to display  EKG   Radiology No results found.  Procedures Procedures (including critical care time)  Medications Ordered in UC Medications  Tdap (BOOSTRIX) injection 0.5 mL (0.5 mLs Intramuscular Given 03/23/20 1005)    Initial Impression / Assessment and Plan / UC Course  I have reviewed the triage vital signs and the nursing notes.  Pertinent labs & imaging results that were available during my care of the patient were reviewed by me and considered in my medical decision making (see chart for details).   Abrasions of multiple sites.  Tetanus updated.  Wounds cleaned and dressed.  Wound care instructions and signs of infection discussed with patient.  Instructed him to follow-up with his PCP or return here if he notes signs of infection.  Patient agrees to plan of care.      Final Clinical Impressions(s) / UC Diagnoses   Final diagnoses:  Abrasions of multiple sites     Discharge Instructions     Your tetanus was updated today.    Keep your wounds clean and dry.  Wash them gently twice a day with soap and  water.  Apply an antibiotic cream and bandage twice a day.    Follow up with your primary care provider or return here if you see signs of infection, such as increased pain, redness, pus-like drainage,  warmth, fever, chills, or other concerning symptoms.         ED Prescriptions    None     PDMP not reviewed this encounter.   Sharion Balloon, NP 03/23/20 1008

## 2020-03-23 NOTE — ED Triage Notes (Signed)
Patient reports he took a fall this morning around 0730. Patient fell on pavement. Reports he has cleaned them in the shower and put neosporin on all the wounds. Patient denies hitting his head.

## 2020-04-04 ENCOUNTER — Other Ambulatory Visit: Payer: Self-pay

## 2020-04-05 ENCOUNTER — Encounter: Payer: Self-pay | Admitting: Nurse Practitioner

## 2020-04-05 ENCOUNTER — Ambulatory Visit: Payer: 59 | Admitting: Nurse Practitioner

## 2020-04-05 VITALS — BP 112/80 | HR 58 | Temp 98.3°F | Ht 70.0 in | Wt 172.0 lb

## 2020-04-05 DIAGNOSIS — R7989 Other specified abnormal findings of blood chemistry: Secondary | ICD-10-CM | POA: Diagnosis not present

## 2020-04-05 DIAGNOSIS — Z0000 Encounter for general adult medical examination without abnormal findings: Secondary | ICD-10-CM

## 2020-04-05 DIAGNOSIS — E785 Hyperlipidemia, unspecified: Secondary | ICD-10-CM | POA: Diagnosis not present

## 2020-04-05 NOTE — Patient Instructions (Addendum)
Was very nice to meet you today.  Please arrange at the front desk for a fasting lab appointment.  The following week we can meet for a complete physical exam and go over the labs and make further recommendations.  You have history of low vitamin D, continue to take your current supplementation.  Follow-up office visit as per your schedule.  Preventing High Cholesterol Cholesterol is a white, waxy substance similar to fat that the human body needs to help build cells. The liver makes all the cholesterol that a person's body needs. Having high cholesterol (hypercholesterolemia) increases a person's risk for heart disease and stroke. Extra (excess) cholesterol comes from the food the person eats. High cholesterol can often be prevented with diet and lifestyle changes. If you already have high cholesterol, you can control it with diet and lifestyle changes and with medicine. How can high cholesterol affect me? If you have high cholesterol, deposits (plaques) may build up on the walls of your arteries. The arteries are the blood vessels that carry blood away from your heart. Plaques make the arteries narrower and stiffer. This can limit or block blood flow and cause blood clots to form. Blood clots:  Are tiny balls of cells that form in your blood.  Can move to the heart or brain, causing a heart attack or stroke. Plaques in arteries greatly increase your risk for heart attack and stroke.Making diet and lifestyle changes can reduce your risk for these conditions that may threaten your life. What can increase my risk? This condition is more likely to develop in people who:  Eat foods that are high in saturated fat or cholesterol. Saturated fat is mostly found in: ? Foods that contain animal fat, such as red meat and some dairy products. ? Certain fatty foods made from plants, such as tropical oils.  Are overweight.  Are not getting enough exercise.  Have a family history of high  cholesterol. What actions can I take to prevent this? Nutrition   Eat less saturated fat.  Avoid trans fats (partially hydrogenated oils). These are often found in margarine and in some baked goods, fried foods, and snacks bought in packages.  Avoid precooked or cured meat, such as sausages or meat loaves.  Avoid foods and drinks that have added sugars.  Eat more fruits, vegetables, and whole grains.  Choose healthy sources of protein, such as fish, poultry, lean cuts of red meat, beans, peas, lentils, and nuts.  Choose healthy sources of fat, such as: ? Nuts. ? Vegetable oils, especially olive oil. ? Fish that have healthy fats (omega-3 fatty acids), such as mackerel or salmon. The items listed above may not be a complete list of recommended foods and beverages. Contact a dietitian for more information. Lifestyle  Lose weight if you are overweight. Losing 5-10 lb (2.3-4.5 kg) can help prevent or control high cholesterol. It can also lower your risk for diabetes and high blood pressure. Ask your health care provider to help you with a diet and exercise plan to lose weight safely.  Do not use any products that contain nicotine or tobacco, such as cigarettes, e-cigarettes, and chewing tobacco. If you need help quitting, ask your health care provider.  Limit your alcohol intake. ? Do not drink alcohol if:  Your health care provider tells you not to drink.  You are pregnant, may be pregnant, or are planning to become pregnant. ? If you drink alcohol:  Limit how much you use to:  0-1 drink a  day for women.  0-2 drinks a day for men.  Be aware of how much alcohol is in your drink. In the [place] one drink equals one 12 oz bottle of beer (355 mL), one 5 oz glass of wine (148 mL), or one 1 oz glass of hard liquor (44 mL). Activity   Get enough exercise. Each week, do at least 150 minutes of exercise that takes a medium level of effort (moderate-intensity exercise). ? This is  exercise that:  Makes your heart beat faster and makes you breathe harder than usual.  Allows you to still be able to talk. ? You could exercise in short sessions several times a day or longer sessions a few times a week. For example, on 5 days each week, you could walk fast or ride your bike 3 times a day for 10 minutes each time.  Do exercises as told by your health care provider. Medicines  In addition to diet and lifestyle changes, your health care provider may recommend medicines to help lower cholesterol. This may be a medicine to lower the amount of cholesterol your liver makes. You may need medicine if: ? Diet and lifestyle changes do not lower your cholesterol enough. ? You have high cholesterol and other risk factors for heart disease or stroke.  Take over-the-counter and prescription medicines only as told by your health care provider. General information  Manage your risk factors for high cholesterol. Talk with your health care provider about all your risk factors and how to lower your risk.  Manage other conditions that you have, such as diabetes or high blood pressure (hypertension).  Have blood tests to check your cholesterol levels at regular points in time as told by your health care provider.  Keep all follow-up visits as told by your health care provider. This is important. Where to find more information  American Heart Association: www.heart.org  National Heart, Lung, and Blood Institute: https://wilson-eaton.com/ Summary  High cholesterol increases your risk for heart disease and stroke. By keeping your cholesterol level low, you can reduce your risk for these conditions.  High cholesterol can often be prevented with diet and lifestyle changes.  Work with your health care provider to manage your risk factors, and have your blood tested regularly. This information is not intended to replace advice given to you by your health care provider. Make sure you discuss any  questions you have with your health care provider. Document Revised: 11/28/2018 Document Reviewed: 04/14/2016 Elsevier Patient Education  2020 [employer].

## 2020-04-05 NOTE — Progress Notes (Signed)
New Patient Office Visit  Subjective:  Patient ID: Brian Cox, male    DOB: [birth year]  Age: 44 y.o. MRN: [id number]  CC:  Chief Complaint  Patient presents with  . New Patient (Initial Visit)    establish care    HPI Brian Cox  " Brian Cox" presents to establish care with  new provider d/t insurance changes. He was a long time patient of Dr. Golden Pop. He reports no significant PMH, PSH and takes no medications. He has no concerns to discuss today. FH: Pos HLD, HTN, DM.  HLD: chart review shows elevated LDL: Runs 3-4 times a week for 3 miles. He eats a healthy diet and never smoked and no regular alcohol use Lab Results  Component Value Date   CHOL 168 05/26/2019   HDL 39 (L) 05/26/2019   LDLCALC 116 (H) 05/26/2019   TRIG 68 05/26/2019   CHOLHDL 4.3 05/26/2019    Low Vitamin D: He reports a hx of low vitamin D and has been taking supplement. No hx of fractures. He has had abrasions from falls when running in the past.   Past Medical History:  Diagnosis Date  . Hyperlipidemia   . Prostatitis, acute 2019    History reviewed. No pertinent surgical history.  Family History  Problem Relation Age of Onset  . Hyperlipidemia Mother   . Hypertension Father   . Arthritis Father   . Diabetes Father   . Aneurysm Maternal Grandmother   . Stroke Maternal Grandmother   . Heart disease Maternal Grandfather   . Miscarriages / Stillbirths Sister   . Cancer Paternal Grandmother     Social History   Socioeconomic History  . Marital status: Single    Spouse name: Not on file  . Number of children: Not on file  . Years of education: Not on file  . Highest education level: Doctorate  Occupational History  . Not on file  Tobacco Use  . Smoking status: Never Smoker  . Smokeless tobacco: Never Used  Vaping Use  . Vaping Use: Never used  Substance and Sexual Activity  . Alcohol use: No  . Drug use: No  . Sexual activity: Yes  Other Topics Concern  . 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       Lives alone- partner, no pets, runs for exercise   Social Determinants of Health   Financial Resource Strain:   . Difficulty of Paying Living Expenses: Not on file  Food Insecurity:   . Worried About Charity fundraiser in the Last Year: Not on file  . Ran Out of Food in the Last Year: Not on file  Transportation Needs:   . Lack of Transportation (Medical): Not on file  . Lack of Transportation (Non-Medical): Not on file  Physical Activity:   . Days of Exercise per Week: Not on file  . Minutes of Exercise per Session: Not on file  Stress:   . Feeling of Stress : Not on file  Social Connections:   . Frequency of Communication with Friends and Family: Not on file  . Frequency of Social Gatherings with Friends and Family: Not on file  . Attends Religious Services: Not on file  . Active Member of Clubs or Organizations: Not on file  . Attends Archivist Meetings: Not on file  . Marital Status: Not on file  Intimate Partner Violence:   . Fear of Current or Ex-Partner: Not on file  . Emotionally Abused: Not on file  . Physically Abused: Not on file  . Sexually Abused: Not on file    ROS Complete ROS x 10 and all negative.    Objective:   Today's Vitals: BP 112/80 (BP Location: Left Arm, Patient Position: Sitting, Cuff Size: Normal)   Pulse (!) 58   Temp 98.3 F (36.8 C) (Oral)   Ht 5\' 10"  (1.778 m)   Wt 172 lb (78 kg)   SpO2 99%   BMI 24.68 kg/m   Physical Exam Vitals reviewed.  Constitutional:      Appearance: Normal appearance. He is normal weight.  HENT:     Head: Normocephalic.  Eyes:     Conjunctiva/sclera: Conjunctivae normal.     Pupils: Pupils are equal, round, and reactive to light.  Cardiovascular:     Rate and Rhythm: Normal rate and regular rhythm.     Pulses: Normal pulses.     Heart sounds: Normal heart sounds.   Pulmonary:     Effort: Pulmonary effort is normal.     Breath sounds: Normal breath sounds.  Abdominal:     Palpations: Abdomen is soft.     Tenderness: There is no abdominal tenderness.  Musculoskeletal:        General: Normal range of motion.     Cervical back: Normal range of motion and neck supple.  Skin:    General: Skin is warm and dry.  Neurological:     General: No focal deficit present.     Mental Status: He is alert and oriented to person, place, and time.  Psychiatric:        Mood and Affect: Mood normal.        Behavior: Behavior normal.        Thought Content: Thought content normal.        Judgment: Judgment normal.     Comments: No concerns about depression/anxiety     Assessment & Plan:   Problem List Items Addressed This Visit      Other   Hyperlipidemia - Primary   Relevant Orders   Hemoglobin A1c   Lipid panel   Low vitamin D level   Encounter for medical examination to establish care   Relevant Orders   CBC with Differential/Platelet   TSH   Comprehensive metabolic panel   P10 and Folate Panel      Outpatient Encounter Medications as of 04/05/2020  Medication Sig  . Cholecalciferol (VITAMIN D3) 5000 UNITS CAPS Take 5,000 Units by mouth daily.   No facility-administered encounter medications on file as of 04/05/2020.   Please arrange at the front desk for a fasting lab appointment.  The following week we can meet for a complete physical exam and go over the labs and make further recommendations.  You have history of low vitamin D, continue to take your current supplementation.  Follow-up office visit as per your schedule.  Follow-up: Return in about 1 week (around 04/12/2020).  This visit occurred during the SARS-CoV-2 public health emergency.  Safety protocols were in place, including screening questions prior to the visit, additional usage of staff PPE, and extensive cleaning of exam room while observing appropriate contact time as indicated  for disinfecting solutions.   Denice Paradise, NP

## 2020-04-07 ENCOUNTER — Telehealth: Payer: Self-pay | Admitting: *Deleted

## 2020-04-07 NOTE — Telephone Encounter (Signed)
Please place future orders for lab appt.   All orders entered on 8/17/21are still pending. Please make sure they are future and sign them so we can see them on our end.  Thanks

## 2020-04-08 ENCOUNTER — Encounter: Payer: Self-pay | Admitting: Nurse Practitioner

## 2020-04-08 DIAGNOSIS — Z0000 Encounter for general adult medical examination without abnormal findings: Secondary | ICD-10-CM | POA: Insufficient documentation

## 2020-04-13 ENCOUNTER — Other Ambulatory Visit: Payer: Self-pay

## 2020-04-13 ENCOUNTER — Other Ambulatory Visit (INDEPENDENT_AMBULATORY_CARE_PROVIDER_SITE_OTHER): Payer: 59

## 2020-04-13 DIAGNOSIS — R7989 Other specified abnormal findings of blood chemistry: Secondary | ICD-10-CM

## 2020-04-13 DIAGNOSIS — Z0000 Encounter for general adult medical examination without abnormal findings: Secondary | ICD-10-CM

## 2020-04-13 DIAGNOSIS — E785 Hyperlipidemia, unspecified: Secondary | ICD-10-CM | POA: Diagnosis not present

## 2020-04-13 LAB — COMPREHENSIVE METABOLIC PANEL
ALT: 20 U/L (ref 0–53)
AST: 21 U/L (ref 0–37)
Albumin: 4.3 g/dL (ref 3.5–5.2)
Alkaline Phosphatase: 60 U/L (ref 39–117)
BUN: 12 mg/dL (ref 6–23)
CO2: 30 mEq/L (ref 19–32)
Calcium: 9.2 mg/dL (ref 8.4–10.5)
Chloride: 105 mEq/L (ref 96–112)
Creatinine, Ser: 0.98 mg/dL (ref 0.40–1.50)
GFR: 83.35 mL/min (ref >60.00)
Glucose, Bld: 96 mg/dL (ref 70–99)
Potassium: 4.2 mEq/L (ref 3.5–5.1)
Sodium: 139 mEq/L (ref 135–145)
Total Bilirubin: 0.4 mg/dL (ref 0.2–1.2)
Total Protein: 7.2 g/dL (ref 6.0–8.3)

## 2020-04-13 LAB — B12 AND FOLATE PANEL
Folate: 16.1 ng/mL (ref >5.9)
Vitamin B-12: 247 pg/mL (ref 211–911)

## 2020-04-13 LAB — TSH: TSH: 1.9 u[IU]/mL (ref 0.35–4.50)

## 2020-04-13 LAB — CBC WITH DIFFERENTIAL/PLATELET
Basophils Absolute: 0.1 10*3/uL (ref 0.0–0.1)
Basophils Relative: 1.8 % (ref 0.0–3.0)
Eosinophils Absolute: 0.3 10*3/uL (ref 0.0–0.7)
Eosinophils Relative: 4.8 % (ref 0.0–5.0)
HCT: 45.1 % (ref 39.0–52.0)
Hemoglobin: 15.4 g/dL (ref 13.0–17.0)
Lymphocytes Relative: 27.2 % (ref 12.0–46.0)
Lymphs Abs: 1.5 10*3/uL (ref 0.7–4.0)
MCHC: 34.1 g/dL (ref 30.0–36.0)
MCV: 87.7 fl (ref 78.0–100.0)
Monocytes Absolute: 0.5 10*3/uL (ref 0.1–1.0)
Monocytes Relative: 9.4 % (ref 3.0–12.0)
Neutro Abs: 3.1 10*3/uL (ref 1.4–7.7)
Neutrophils Relative %: 56.8 % (ref 43.0–77.0)
Platelets: 198 10*3/uL (ref 150.0–400.0)
RBC: 5.14 Mil/uL (ref 4.22–5.81)
RDW: 13.2 % (ref 11.5–15.5)
WBC: 5.5 10*3/uL (ref 4.0–10.5)

## 2020-04-13 LAB — LIPID PANEL
Cholesterol: 162 mg/dL (ref 0–200)
HDL: 37.9 mg/dL — ABNORMAL LOW (ref >39.00)
LDL Cholesterol: 111 mg/dL — ABNORMAL HIGH (ref 0–99)
NonHDL: 123.81
Total CHOL/HDL Ratio: 4
Triglycerides: 65 mg/dL (ref 0.0–149.0)
VLDL: 13 mg/dL (ref 0.0–40.0)

## 2020-04-13 LAB — VITAMIN D 25 HYDROXY (VIT D DEFICIENCY, FRACTURES): VITD: 35.98 ng/mL (ref 30.00–100.00)

## 2020-04-13 LAB — HEMOGLOBIN A1C: Hgb A1c MFr Bld: 5.5 % (ref 4.6–6.5)

## 2020-04-14 ENCOUNTER — Other Ambulatory Visit: Payer: 59

## 2020-04-19 ENCOUNTER — Encounter: Payer: Self-pay | Admitting: Nurse Practitioner

## 2020-04-19 ENCOUNTER — Other Ambulatory Visit: Payer: Self-pay

## 2020-04-19 ENCOUNTER — Ambulatory Visit (INDEPENDENT_AMBULATORY_CARE_PROVIDER_SITE_OTHER): Payer: 59 | Admitting: Nurse Practitioner

## 2020-04-19 VITALS — BP 120/68 | HR 65 | Temp 98.1°F | Ht 70.0 in | Wt 173.0 lb

## 2020-04-19 DIAGNOSIS — Z23 Encounter for immunization: Secondary | ICD-10-CM

## 2020-04-19 DIAGNOSIS — E785 Hyperlipidemia, unspecified: Secondary | ICD-10-CM

## 2020-04-19 DIAGNOSIS — R7989 Other specified abnormal findings of blood chemistry: Secondary | ICD-10-CM

## 2020-04-19 NOTE — Progress Notes (Signed)
Established Patient Office Visit  Subjective:  Patient ID: Brian Cox, male    DOB: [birth year]  Age: 44 y.o. MRN: [id number]  CC:  Chief Complaint  Patient presents with  . Annual Exam    CPE    HPI Brian Cox is a 44 year old who prefers to go by the name of [place]. He comes in for a wellness exam.  He takes no medications.  He has had no current complaints.   He has a jogger.  On August 4, he tripped on uneven pavement.  He fell down and scraped his right knee.  Scabbed over.  It was healing well.  Then he was kneeling on his knee to do some work and he has developed a blood blister.  He has had no signs of cellulitis.   HLD: Heavy dairy drinker 2% milk and cheese. Otherwise diet is very good. Lab Results  Component Value Date   CHOL 162 04/13/2020   HDL 37.90 (L) 04/13/2020   LDLCALC 111 (H) 04/13/2020   TRIG 65.0 04/13/2020   CHOLHDL 4 04/13/2020   Patient presents today for complete physical.  Immunizations:Flu shot- needed otherwise UTD.  Diet: healthy Exercise: runner Dentist: Never had a cavity- UTD Vision: due not done 2019   Past Medical History:  Diagnosis Date  . Hyperlipidemia   . Prostatitis, acute 2019    History reviewed. No pertinent surgical history.  Family History  Problem Relation Age of Onset  . Hyperlipidemia Mother   . Hypertension Father   . Arthritis Father   . Diabetes Father   . Aneurysm Maternal Grandmother   . Stroke Maternal Grandmother   . Heart disease Maternal Grandfather   . Miscarriages / Stillbirths Sister   . Cancer Paternal Grandmother     Social History   Socioeconomic History  . Marital status: Single    Spouse name: Not on file  . Number of children: Not on file  . Years of education: Not on file  . Highest education level: Doctorate  Occupational History  . Not on file  Tobacco Use  . Smoking status: Never Smoker  . Smokeless tobacco: Never Used  Vaping Use  . Vaping Use: Never used    Substance and Sexual Activity  . Alcohol use: No  . Drug use: No  . Sexual activity: Yes  Other Topics Concern  . 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       Lives alone- partner, no pets, runs for exercise   Social Determinants of Health   Financial Resource Strain:   . Difficulty of Paying Living Expenses: Not on file  Food Insecurity:   . Worried About Charity fundraiser in the Last Year: Not on file  . Ran Out of Food in the Last Year: Not on file  Transportation Needs:   . Lack of Transportation (Medical): Not on file  . Lack of Transportation (Non-Medical): Not on file  Physical Activity:   . Days of Exercise per Week: Not on file  . Minutes of Exercise per Session: Not on file  Stress:   . Feeling of Stress : Not on file  Social Connections:   . Frequency of Communication with Friends and Family: Not on file  . Frequency of Social Gatherings with Friends and Family: Not on file  . Attends Religious Services: Not on file  . Active Member  of Clubs or Organizations: Not on file  . Attends Archivist Meetings: Not on file  . Marital Status: Not on file  Intimate Partner Violence:   . Fear of Current or Ex-Partner: Not on file  . Emotionally Abused: Not on file  . Physically Abused: Not on file  . Sexually Abused: Not on file    Outpatient Medications Prior to Visit  Medication Sig Dispense Refill  . Cholecalciferol (VITAMIN D3) 5000 UNITS CAPS Take 5,000 Units by mouth daily.     No facility-administered medications prior to visit.    Allergies  Allergen Reactions  . Ciprofloxacin Hcl Swelling  . Donnatal  [Phenobarbital-Belladonna Alk] Swelling  . Levofloxacin Swelling   Review of Systems  Constitutional: Negative.   HENT: Negative.   Eyes: Negative.   Respiratory: Negative.   Cardiovascular: Negative.   Gastrointestinal: Negative.   Endocrine:  Negative.   Genitourinary: Negative.   Musculoskeletal: Negative.   Skin: Positive for wound.  Allergic/Immunologic: Negative.   Neurological: Negative.   Psychiatric/Behavioral: Negative.       Objective:    Physical Exam Vitals reviewed.  Constitutional:      Appearance: Normal appearance. He is normal weight.  HENT:     Head: Normocephalic and atraumatic.     Right Ear: Tympanic membrane normal.     Left Ear: Tympanic membrane normal.  Eyes:     Conjunctiva/sclera: Conjunctivae normal.     Pupils: Pupils are equal, round, and reactive to light.  Cardiovascular:     Rate and Rhythm: Normal rate and regular rhythm.     Pulses: Normal pulses.  Pulmonary:     Effort: Pulmonary effort is normal.     Breath sounds: Normal breath sounds.  Abdominal:     Palpations: Abdomen is soft.     Tenderness: There is no abdominal tenderness.  Musculoskeletal:        General: Normal range of motion.     Cervical back: Normal range of motion and neck supple.  Skin:    General: Skin is warm and dry.     Comments: Right knee with healing scar and over this is a blood blister.   Neurological:     General: No focal deficit present.     Mental Status: He is alert and oriented to person, place, and time.  Psychiatric:        Mood and Affect: Mood normal.        Behavior: Behavior normal.     BP 120/68 (BP Location: Left Arm, Patient Position: Sitting, Cuff Size: Normal)   Pulse 65   Temp 98.1 F (36.7 C) (Oral)   Ht '5\' 10"'  (1.778 m)   Wt 173 lb (78.5 kg)   SpO2 98%   BMI 24.82 kg/m  Wt Readings from Last 3 Encounters:  04/19/20 173 lb (78.5 kg)  04/05/20 172 lb (78 kg)  05/26/19 169 lb (76.7 kg)     Health Maintenance Due  Topic Date Due  . INFLUENZA VACCINE  03/20/2020    There are no preventive care reminders to display for this patient.  Lab Results  Component Value Date   TSH 1.90 04/13/2020   Lab Results  Component Value Date   WBC 5.5 04/13/2020   HGB 15.4  04/13/2020   HCT 45.1 04/13/2020   MCV 87.7 04/13/2020   PLT 198.0 04/13/2020   Lab Results  Component Value Date   NA 139 04/13/2020   K 4.2 04/13/2020   CO2 30 04/13/2020  GLUCOSE 96 04/13/2020   BUN 12 04/13/2020   CREATININE 0.98 04/13/2020   BILITOT 0.4 04/13/2020   ALKPHOS 60 04/13/2020   AST 21 04/13/2020   ALT 20 04/13/2020   PROT 7.2 04/13/2020   ALBUMIN 4.3 04/13/2020   CALCIUM 9.2 04/13/2020   ANIONGAP 8 09/01/2016   GFR 83.35 04/13/2020   Lab Results  Component Value Date   CHOL 162 04/13/2020   Lab Results  Component Value Date   HDL 37.90 (L) 04/13/2020   Lab Results  Component Value Date   LDLCALC 111 (H) 04/13/2020   Lab Results  Component Value Date   TRIG 65.0 04/13/2020   Lab Results  Component Value Date   CHOLHDL 4 04/13/2020   Lab Results  Component Value Date   HGBA1C 5.5 04/13/2020      Assessment & Plan:   Problem List Items Addressed This Visit      Other   Hyperlipidemia - Primary   Low vitamin D level     Continue with healthy lifestyle.  Your BMI is excellent.  Your jogging is excellent.  We did talk about cutting back on higher fat dairy, and 2% milk.  LDL is elevated.  I think with adjustments into nonfat and low-fat dairy products, you will see improvements in your lipids.  Continue using sunscreen.  Your vitamin D is well supplemented.  Did not think you need to take the 5000 IUs daily.  We can drop that back to 1000 IU daily.  We can follow the vitamin D level along annually.  You are up-to-date on your vaccines.  You are due for a flu shot today.  You do need to see an eye doctor for annual checkup.  Your blood blister on your right knee, should continue to heal.  Monitor for any signs of infections.  Follow-up office visit in 1 year or sooner if you are having any problems. No orders of the defined types were placed in this encounter.   Follow-up: Return in about 1 year (around 04/19/2021).  This visit  occurred during the SARS-CoV-2 public health emergency.  Safety protocols were in place, including screening questions prior to the visit, additional usage of staff PPE, and extensive cleaning of exam room while observing appropriate contact time as indicated for disinfecting solutions.    Denice Paradise, NP

## 2020-04-19 NOTE — Patient Instructions (Addendum)
Continue with healthy lifestyle.  Your BMI is excellent.  Your jogging is excellent.  We did talk about cutting back on higher fat dairy, and 2% milk.  LDL is elevated.  I think with adjustments into nonfat and low-fat dairy products, you will see improvements in your lipids.  Continue using sunscreen.   Your vitamin D is well supplemented.  Did not think you need to take the 5000 IUs daily.  We can drop that back to 1000 IU daily.  We can follow the vitamin D level along annually.  You are up-to-date on your vaccines.  You are due for a flu shot today.  You do need to see an eye doctor for annual checkup.  Your blood blister on your right knee, should continue to heal.  Monitor for any signs of infections.  Follow-up office visit in 1 year or sooner if you are having any problems. Dyslipidemia Dyslipidemia is an imbalance of waxy, fat-like substances (lipids) in the blood. The body needs lipids in small amounts. Dyslipidemia often involves a high level of cholesterol or triglycerides, which are types of lipids. Common forms of dyslipidemia include:  High levels of LDL cholesterol. LDL is the type of cholesterol that causes fatty deposits (plaques) to build up in the blood vessels that carry blood away from your heart (arteries).  Low levels of HDL cholesterol. HDL cholesterol is the type of cholesterol that protects against heart disease. High levels of HDL remove the LDL buildup from arteries.  High levels of triglycerides. Triglycerides are a fatty substance in the blood that is linked to a buildup of plaques in the arteries. What are the causes? Primary dyslipidemia is caused by changes (mutations) in genes that are passed down through families (inherited). These mutations cause several types of dyslipidemia. Secondary dyslipidemia is caused by lifestyle choices and diseases that lead to dyslipidemia, such as:  Eating a diet that is high in animal fat.  Not getting enough  exercise.  Having diabetes, kidney disease, liver disease, or thyroid disease.  Drinking large amounts of alcohol.  Using certain medicines. What increases the risk? You are more likely to develop this condition if you are an older man or if you are a woman who has gone through menopause. Other risk factors include:  Having a family history of dyslipidemia.  Taking certain medicines, including birth control pills, steroids, some diuretics, and beta-blockers.  Smoking cigarettes.  Eating a high-fat diet.  Having certain medical conditions such as diabetes, polycystic ovary syndrome (PCOS), kidney disease, liver disease, or hypothyroidism.  Not exercising regularly.  Being overweight or obese with too much belly fat. What are the signs or symptoms? In most cases, dyslipidemia does not usually cause any symptoms. In severe cases, very high lipid levels can cause:  Fatty bumps under the skin (xanthomas).  White or gray ring around the black center (pupil) of the eye. Very high triglyceride levels can cause inflammation of the pancreas (pancreatitis). How is this diagnosed? Your health care provider may diagnose dyslipidemia based on a routine blood test (fasting blood test). Because most people do not have symptoms of the condition, this blood testing (lipid profile) is done on adults age 19 and older and is repeated every 5 years. This test checks:  Total cholesterol. This measures the total amount of cholesterol in your blood, including LDL cholesterol, HDL cholesterol, and triglycerides. A healthy number is below 200.  LDL cholesterol. The target number for LDL cholesterol is different for each person, depending  on individual risk factors. Ask your health care provider what your LDL cholesterol should be.  HDL cholesterol. An HDL level of 60 or higher is best because it helps to protect against heart disease. A number below 40 for men or below 69 for women increases the risk for  heart disease.  Triglycerides. A healthy triglyceride number is below 150. If your lipid profile is abnormal, your health care provider may do other blood tests. How is this treated? Treatment depends on the type of dyslipidemia that you have and your other risk factors for heart disease and stroke. Your health care provider will have a target range for your lipid levels based on this information. For many people, this condition may be treated by lifestyle changes, such as diet and exercise. Your health care provider may recommend that you:  Get regular exercise.  Make changes to your diet.  Quit smoking if you smoke. If diet changes and exercise do not help you reach your goals, your health care provider may also prescribe medicine to lower lipids. The most commonly prescribed type of medicine lowers your LDL cholesterol (statin drug). If you have a high triglyceride level, your provider may prescribe another type of drug (fibrate) or an omega-3 fish oil supplement, or both. Follow these instructions at home:  Eating and drinking  Follow instructions from your health care provider or dietitian about eating or drinking restrictions.  Eat a healthy diet as told by your health care provider. This can help you reach and maintain a healthy weight, lower your LDL cholesterol, and raise your HDL cholesterol. This may include: ? Limiting your calories, if you are overweight. ? Eating more fruits, vegetables, whole grains, fish, and lean meats. ? Limiting saturated fat, trans fat, and cholesterol.  If you drink alcohol: ? Limit how much you use. ? Be aware of how much alcohol is in your drink. In the [place] one drink equals one 12 oz bottle of beer (355 mL), one 5 oz glass of wine (148 mL), or one 1 oz glass of hard liquor (44 mL).  Do not drink alcohol if: ? Your health care provider tells you not to drink. ? You are pregnant, may be pregnant, or are planning to become  pregnant. Activity  Get regular exercise. Start an exercise and strength training program as told by your health care provider. Ask your health care provider what activities are safe for you. Your health care provider may recommend: ? 30 minutes of aerobic activity 4-6 days a week. Brisk walking is an example of aerobic activity. ? Strength training 2 days a week. General instructions  Do not use any products that contain nicotine or tobacco, such as cigarettes, e-cigarettes, and chewing tobacco. If you need help quitting, ask your health care provider.  Take over-the-counter and prescription medicines only as told by your health care provider. This includes supplements.  Keep all follow-up visits as told by your health care provider. Contact a health care provider if:  You are: ? Having trouble sticking to your exercise or diet plan. ? Struggling to quit smoking or control your use of alcohol. Summary  Dyslipidemia often involves a high level of cholesterol or triglycerides, which are types of lipids.  Treatment depends on the type of dyslipidemia that you have and your other risk factors for heart disease and stroke.  For many people, treatment starts with lifestyle changes, such as diet and exercise.  Your health care provider may prescribe medicine to lower lipids.  This information is not intended to replace advice given to you by your health care provider. Make sure you discuss any questions you have with your health care provider. Document Revised: 03/31/2018 Document Reviewed: 03/07/2018 Elsevier Patient Education  [employer].  Preventing High Cholesterol Cholesterol is a white, waxy substance similar to fat that the human body needs to help build cells. The liver makes all the cholesterol that a person's body needs. Having high cholesterol (hypercholesterolemia) increases a person's risk for heart disease and stroke. Extra (excess) cholesterol comes from the food the  person eats. High cholesterol can often be prevented with diet and lifestyle changes. If you already have high cholesterol, you can control it with diet and lifestyle changes and with medicine. How can high cholesterol affect me? If you have high cholesterol, deposits (plaques) may build up on the walls of your arteries. The arteries are the blood vessels that carry blood away from your heart. Plaques make the arteries narrower and stiffer. This can limit or block blood flow and cause blood clots to form. Blood clots:  Are tiny balls of cells that form in your blood.  Can move to the heart or brain, causing a heart attack or stroke. Plaques in arteries greatly increase your risk for heart attack and stroke.Making diet and lifestyle changes can reduce your risk for these conditions that may threaten your life. What can increase my risk? This condition is more likely to develop in people who:  Eat foods that are high in saturated fat or cholesterol. Saturated fat is mostly found in: ? Foods that contain animal fat, such as red meat and some dairy products. ? Certain fatty foods made from plants, such as tropical oils.  Are overweight.  Are not getting enough exercise.  Have a family history of high cholesterol. What actions can I take to prevent this? Nutrition   Eat less saturated fat.  Avoid trans fats (partially hydrogenated oils). These are often found in margarine and in some baked goods, fried foods, and snacks bought in packages.  Avoid precooked or cured meat, such as sausages or meat loaves.  Avoid foods and drinks that have added sugars.  Eat more fruits, vegetables, and whole grains.  Choose healthy sources of protein, such as fish, poultry, lean cuts of red meat, beans, peas, lentils, and nuts.  Choose healthy sources of fat, such as: ? Nuts. ? Vegetable oils, especially olive oil. ? Fish that have healthy fats (omega-3 fatty acids), such as mackerel or salmon. The  items listed above may not be a complete list of recommended foods and beverages. Contact a dietitian for more information. Lifestyle  Lose weight if you are overweight. Losing 5-10 lb (2.3-4.5 kg) can help prevent or control high cholesterol. It can also lower your risk for diabetes and high blood pressure. Ask your health care provider to help you with a diet and exercise plan to lose weight safely.  Do not use any products that contain nicotine or tobacco, such as cigarettes, e-cigarettes, and chewing tobacco. If you need help quitting, ask your health care provider.  Limit your alcohol intake. ? Do not drink alcohol if:  Your health care provider tells you not to drink.  You are pregnant, may be pregnant, or are planning to become pregnant. ? If you drink alcohol:  Limit how much you use to:  0-1 drink a day for women.  0-2 drinks a day for men.  Be aware of how much alcohol is in  your drink. In the [place] one drink equals one 12 oz bottle of beer (355 mL), one 5 oz glass of wine (148 mL), or one 1 oz glass of hard liquor (44 mL). Activity   Get enough exercise. Each week, do at least 150 minutes of exercise that takes a medium level of effort (moderate-intensity exercise). ? This is exercise that:  Makes your heart beat faster and makes you breathe harder than usual.  Allows you to still be able to talk. ? You could exercise in short sessions several times a day or longer sessions a few times a week. For example, on 5 days each week, you could walk fast or ride your bike 3 times a day for 10 minutes each time.  Do exercises as told by your health care provider. Medicines  In addition to diet and lifestyle changes, your health care provider may recommend medicines to help lower cholesterol. This may be a medicine to lower the amount of cholesterol your liver makes. You may need medicine if: ? Diet and lifestyle changes do not lower your cholesterol enough. ? You have high  cholesterol and other risk factors for heart disease or stroke.  Take over-the-counter and prescription medicines only as told by your health care provider. General information  Manage your risk factors for high cholesterol. Talk with your health care provider about all your risk factors and how to lower your risk.  Manage other conditions that you have, such as diabetes or high blood pressure (hypertension).  Have blood tests to check your cholesterol levels at regular points in time as told by your health care provider.  Keep all follow-up visits as told by your health care provider. This is important. Where to find more information  American Heart Association: www.heart.org  National Heart, Lung, and Blood Institute: https://wilson-eaton.com/ Summary  High cholesterol increases your risk for heart disease and stroke. By keeping your cholesterol level low, you can reduce your risk for these conditions.  High cholesterol can often be prevented with diet and lifestyle changes.  Work with your health care provider to manage your risk factors, and have your blood tested regularly. This information is not intended to replace advice given to you by your health care provider. Make sure you discuss any questions you have with your health care provider. Document Revised: 11/28/2018 Document Reviewed: 04/14/2016 Elsevier Patient Education  2020 [employer] Inc.  Preventive Care 42-93 Years Old, Male Preventive care refers to lifestyle choices and visits with your health care provider that can promote health and wellness. This includes:  A yearly physical exam. This is also called an annual well check.  Regular dental and eye exams.  Immunizations.  Screening for certain conditions.  Healthy lifestyle choices, such as eating a healthy diet, getting regular exercise, not using drugs or products that contain nicotine and tobacco, and limiting alcohol use. What can I expect for my preventive care  visit? Physical exam Your health care provider will check:  Height and weight. These may be used to calculate body mass index (BMI), which is a measurement that tells if you are at a healthy weight.  Heart rate and blood pressure.  Your skin for abnormal spots. Counseling Your health care provider may ask you questions about:  Alcohol, tobacco, and drug use.  Emotional well-being.  Home and relationship well-being.  Sexual activity.  Eating habits.  Work and work Statistician. What immunizations do I need?  Influenza (flu) vaccine  This is recommended every year. Tetanus,  diphtheria, and pertussis (Tdap) vaccine  You may need a Td booster every 10 years. Varicella (chickenpox) vaccine  You may need this vaccine if you have not already been vaccinated. Zoster (shingles) vaccine  You may need this after age 36. Measles, mumps, and rubella (MMR) vaccine  You may need at least one dose of MMR if you were born in 1957 or later. You may also need a second dose. Pneumococcal conjugate (PCV13) vaccine  You may need this if you have certain conditions and were not previously vaccinated. Pneumococcal polysaccharide (PPSV23) vaccine  You may need one or two doses if you smoke cigarettes or if you have certain conditions. Meningococcal conjugate (MenACWY) vaccine  You may need this if you have certain conditions. Hepatitis A vaccine  You may need this if you have certain conditions or if you travel or work in places where you may be exposed to hepatitis A. Hepatitis B vaccine  You may need this if you have certain conditions or if you travel or work in places where you may be exposed to hepatitis B. Haemophilus influenzae type b (Hib) vaccine  You may need this if you have certain risk factors. Human papillomavirus (HPV) vaccine  If recommended by your health care provider, you may need three doses over 6 months. You may receive vaccines as individual doses or as more  than one vaccine together in one shot (combination vaccines). Talk with your health care provider about the risks and benefits of combination vaccines. What tests do I need? Blood tests  Lipid and cholesterol levels. These may be checked every 5 years, or more frequently if you are over 15 years old.  Hepatitis C test.  Hepatitis B test. Screening  Lung cancer screening. You may have this screening every year starting at age 48 if you have a 30-pack-year history of smoking and currently smoke or have quit within the past 15 years.  Prostate cancer screening. Recommendations will vary depending on your family history and other risks.  Colorectal cancer screening. All adults should have this screening starting at age 13 and continuing until age 21. Your health care provider may recommend screening at age 17 if you are at increased risk. You will have tests every 1-10 years, depending on your results and the type of screening test.  Diabetes screening. This is done by checking your blood sugar (glucose) after you have not eaten for a while (fasting). You may have this done every 1-3 years.  Sexually transmitted disease (STD) testing. Follow these instructions at home: Eating and drinking  Eat a diet that includes fresh fruits and vegetables, whole grains, lean protein, and low-fat dairy products.  Take vitamin and mineral supplements as recommended by your health care provider.  Do not drink alcohol if your health care provider tells you not to drink.  If you drink alcohol: ? Limit how much you have to 0-2 drinks a day. ? Be aware of how much alcohol is in your drink. In the [place] one drink equals one 12 oz bottle of beer (355 mL), one 5 oz glass of wine (148 mL), or one 1 oz glass of hard liquor (44 mL). Lifestyle  Take daily care of your teeth and gums.  Stay active. Exercise for at least 30 minutes on 5 or more days each week.  Do not use any products that contain nicotine or  tobacco, such as cigarettes, e-cigarettes, and chewing tobacco. If you need help quitting, ask your health care provider.  If you are sexually active, practice safe sex. Use a condom or other form of protection to prevent STIs (sexually transmitted infections).  Talk with your health care provider about taking a low-dose aspirin every day starting at age 40. What's next?  Go to your health care provider once a year for a well check visit.  Ask your health care provider how often you should have your eyes and teeth checked.  Stay up to date on all vaccines. This information is not intended to replace advice given to you by your health care provider. Make sure you discuss any questions you have with your health care provider. Document Revised: 07/31/2018 Document Reviewed: 07/31/2018 Elsevier Patient Education  2020 [employer].

## 2020-05-27 ENCOUNTER — Encounter: Payer: 59 | Admitting: Family Medicine

## 2020-09-05 ENCOUNTER — Ambulatory Visit: Payer: 59 | Admitting: Family Medicine

## 2020-09-26 ENCOUNTER — Other Ambulatory Visit: Payer: Self-pay

## 2020-09-26 ENCOUNTER — Encounter: Payer: Self-pay | Admitting: Family Medicine

## 2020-09-26 ENCOUNTER — Ambulatory Visit: Payer: 59 | Admitting: Family Medicine

## 2020-09-26 VITALS — BP 110/68 | HR 59 | Temp 97.4°F | Ht 70.2 in | Wt 171.2 lb

## 2020-09-26 DIAGNOSIS — R7989 Other specified abnormal findings of blood chemistry: Secondary | ICD-10-CM | POA: Diagnosis not present

## 2020-09-26 DIAGNOSIS — E785 Hyperlipidemia, unspecified: Secondary | ICD-10-CM

## 2020-09-26 DIAGNOSIS — D170 Benign lipomatous neoplasm of skin and subcutaneous tissue of head, face and neck: Secondary | ICD-10-CM

## 2020-09-26 NOTE — Progress Notes (Signed)
Subjective:     Brian Cox is a 45 y.o. male presenting for Transitions Of Care and Mass (Small marble sized lump on back of head x 5 years. No pain )     HPI  #Mass - present for 5 years - no pain - no growth -   Review of Systems   Social History   Tobacco Use  Smoking Status Never Smoker  Smokeless Tobacco Never Used        Objective:    BP Readings from Last 3 Encounters:  09/26/20 110/68  04/19/20 120/68  04/05/20 112/80   Wt Readings from Last 3 Encounters:  09/26/20 171 lb 4 oz (77.7 kg)  04/19/20 173 lb (78.5 kg)  04/05/20 172 lb (78 kg)    BP 110/68   Pulse (!) 59   Temp (!) 97.4 F (36.3 C) (Temporal)   Ht 5' 10.2" (1.783 m)   Wt 171 lb 4 oz (77.7 kg)   SpO2 99%   BMI 24.43 kg/m    Physical Exam Constitutional:      Appearance: Normal appearance. He is not ill-appearing or diaphoretic.  HENT:     Right Ear: External ear normal.     Left Ear: External ear normal.  Eyes:     General: No scleral icterus.    Extraocular Movements: Extraocular movements intact.     Conjunctiva/sclera: Conjunctivae normal.  Cardiovascular:     Rate and Rhythm: Normal rate and regular rhythm.     Heart sounds: No murmur heard.   Pulmonary:     Effort: Pulmonary effort is normal. No respiratory distress.     Breath sounds: Normal breath sounds. No wheezing.  Musculoskeletal:     Cervical back: Neck supple.  Skin:    General: Skin is warm and dry.     Comments: Lipoma on the left lower occipital.   Neurological:     Mental Status: He is alert. Mental status is at baseline.  Psychiatric:        Mood and Affect: Mood normal.      The 10-year ASCVD risk score Mikey Bussing DC Jr., et al., 2013) is: 1.2%   Values used to calculate the score:     Age: 17 years     Sex: Male     Is Non-Hispanic African American: No     Diabetic: No     Tobacco smoker: No     Systolic Blood Pressure: 893 mmHg     Is BP treated: No     HDL Cholesterol: 37.9 mg/dL      Total Cholesterol: 162 mg/dL      Assessment & Plan:   Problem List Items Addressed This Visit      Musculoskeletal and Integument   Lipoma of neck    Reassurance provided. Continue to monitor. Referral if wanting removed or increasing in size.         Other   Hyperlipidemia - Primary    Cont healthy diet and regular exercise.       Low vitamin D level    Cont daily supplement.           Return in about 7 months (around 04/26/2021) for annual wellness.  Lesleigh Noe, MD  This visit occurred during the SARS-CoV-2 public health emergency.  Safety protocols were in place, including screening questions prior to the visit, additional usage of staff PPE, and extensive cleaning of exam room while observing appropriate contact time as indicated for disinfecting solutions.

## 2020-09-26 NOTE — Assessment & Plan Note (Signed)
Cont daily supplement.

## 2020-09-26 NOTE — Assessment & Plan Note (Signed)
Reassurance provided. Continue to monitor. Referral if wanting removed or increasing in size.

## 2020-09-26 NOTE — Patient Instructions (Signed)
Great to meet you  Keep an eye on your neck mass if it grows in size let me know.

## 2020-09-26 NOTE — Assessment & Plan Note (Signed)
Cont healthy diet and regular exercise.

## 2020-11-23 ENCOUNTER — Encounter: Payer: Self-pay | Admitting: Emergency Medicine

## 2020-11-23 ENCOUNTER — Telehealth: Payer: Self-pay

## 2020-11-23 ENCOUNTER — Ambulatory Visit
Admission: EM | Admit: 2020-11-23 | Discharge: 2020-11-23 | Disposition: A | Discharge status: Home / Self Care | Payer: 59 | Attending: Family Medicine | Admitting: Family Medicine

## 2020-11-23 ENCOUNTER — Other Ambulatory Visit: Payer: Self-pay

## 2020-11-23 DIAGNOSIS — A084 Viral intestinal infection, unspecified: Secondary | ICD-10-CM

## 2020-11-23 MED ORDER — ONDANSETRON HCL 4 MG PO TABS: 4.0000 mg | ORAL_TABLET | Freq: Four times a day (QID) | ORAL | 0 refills | Status: DC

## 2020-11-23 MED ORDER — DIPHENOXYLATE-ATROPINE 2.5-0.025 MG PO TABS
1.0000 | ORAL_TABLET | Freq: Four times a day (QID) | ORAL | 0 refills | Status: DC | PRN
Start: 2020-11-23 — End: 2021-05-02

## 2020-11-23 MED ORDER — FAMOTIDINE 40 MG PO TABS: 40.0000 mg | ORAL_TABLET | Freq: Two times a day (BID) | ORAL | 0 refills | Status: DC

## 2020-11-23 NOTE — ED Triage Notes (Signed)
Patient c/o ABD pain and N/V/D x 1 day.   Patient endorses decreased food/fluid intake. Patient states "I haven't been able to eat anything". Patient states " I ate a salad for lunch then I got sick several hours later".   Patient endorses  a dull pain on both RT and LFT lower quadrants of ABD.   Patient endorses 2 episode of emesis and 5-6 episode of "watery" diarrhea.   Patient took a tum's yesterday afternoon with no relief of symptoms.

## 2020-11-23 NOTE — Telephone Encounter (Signed)
Janie RN with access nurse said pt having abd pain, N&V and wants virtual appt. Unable to do virtual due to abd pain needing in person visit. Advise either UC or ED depending on access nurse disposition. Janie voiced understanding and will convey to pt.

## 2020-11-23 NOTE — Telephone Encounter (Signed)
[facility] - Client TELEPHONE ADVICE RECORD [facility] Patient Name: Brian Cox Gender: Male DOB: [birth year] Age: 45 Y 11 M 11 D Return Phone Number: [phone] (Primary) Address: City/ State/ Zip: [place] [address] Client [facility] - Client Client Site [place] - Day Physician Waunita Schooner- MD Contact Type Call Who Is Calling Patient / Member / Family / Caregiver Call Type Triage / Clinical Relationship To Patient Self Return Phone Number [phone] (Primary) Chief Complaint Abdominal Pain Reason for Call Symptomatic / Request for [place] states the pt has abdominal pain, diarrhea, vomiting and wants to get something in case it comes back. He wants to know if it will be a face time call. Translation No Nurse Assessment Nurse: Micki Riley, RN, Domenick Gong Date/Time (Eastern Time): 11/23/2020 8:12:36 AM Confirm and document reason for call. If symptomatic, describe symptoms. ---Caller states he has been having intermittent abdominal pain (3-4/10 now), vomiting, & diarrhea; first noted yesterday evening. Treated with tums. Denies fever or any other symptoms. Does the patient have any new or worsening symptoms? ---Yes Will a triage be completed? ---Yes Related visit to physician within the last 2 weeks? ---N/A Does the PT have any chronic conditions? (i.e. diabetes, asthma, this includes High risk factors for pregnancy, etc.) ---Unknown Is this a behavioral health or substance abuse call? ---No Guidelines Guideline Title Affirmed Question Affirmed Notes Nurse Date/Time (Eastern Time) Abdominal Pain - Male [1] Vomiting AND [2] abdomen looks much more swollen than usual Cazares, RN, Domenick Gong 11/23/2020 8:14:44 AM Disp. Time Eilene Ghazi Time) Disposition Final User 11/23/2020 8:16:52 AM See HCP within 4 Hours (or PCP  triage) Yes Cazares, RN, Domenick Gong PLEASE NOTE: All timestamps contained within this report are represented as [place] Standard Time. CONFIDENTIALTY NOTICE: This fax transmission is intended only for the addressee. It contains information that is legally privileged, confidential or otherwise protected from use or disclosure. If you are not the intended recipient, you are strictly prohibited from reviewing, disclosing, copying using or disseminating any of this information or taking any action in reliance on or regarding this information. If you have received this fax in error, please notify us immediately by telephone so that we can arrange for its return to [place]. Phone: [phone], Toll-Free: [phone], Fax: [phone] Page: 2 of 2 Call Id: [id number] [place] Disagree/Comply Comply Caller Understands Yes PreDisposition InappropriateToAsk Care Advice Given Per Guideline SEE HCP (OR PCP TRIAGE) WITHIN 4 HOURS: * IF OFFICE WILL BE OPEN: You need to be seen within the next 3 or 4 hours. Call your doctor (or NP/PA) now or as soon as the office opens. CARE ADVICE given per Abdominal Pain, Male (Adult) guideline. NOTHING BY MOUTH: * Do not eat or drink anything for now. CALL BACK IF: * You become worse Comments User: Domenick Gong, Micki Riley, RN Date/Time Eilene Ghazi Time): 11/23/2020 8:23:38 AM Warm transferred patient to office backline. Spoke with Morey Hummingbird & explained patient needs a appointment/to be seen within the next 4hrs due to their symptoms. States patient not able to be seen physically at office with these symptoms. Jun Rightmyer, triage nurse, states patient cannot be seen at office with v/d he needs to go to an UC or ED, no virtual appts available for abdominal pain. Referrals REFERRED TO PCP OFFICE Warm transfer to backline

## 2020-11-23 NOTE — Telephone Encounter (Signed)
Per chart review tab pt is at [facility] in [place].

## 2020-11-23 NOTE — Telephone Encounter (Signed)
Noted, agree with in-person for abdominal pain

## 2020-11-23 NOTE — ED Provider Notes (Signed)
Brian Cox    CSN: [id number] Arrival date & time: 11/23/20  2725      History   Chief Complaint Chief Complaint  Patient presents with  . Abdominal Pain  . Emesis  . Diarrhea    HPI Brian Cox is a 45 y.o. male.   HPI  Patient presents with 1 day of watery multiple episodes of diarrhea, bilateral abdominal pain, emesis. Patient has been afebrile.  Is unaware of any sick contacts. Illness started abruptly yesterday. He has been able to tolerate water but has not attempted to eat any additional food or drink any other beverages.  Past Medical History:  Diagnosis Date  . Hyperlipidemia   . Prostatitis, acute 2019    Patient Active Problem List   Diagnosis Date Noted  . Low vitamin D level 04/05/2020  . Hyperlipidemia 09/25/2017  . Lipoma of neck 09/25/2017    Past Surgical History:  Procedure Laterality Date  . WISDOM TOOTH EXTRACTION         Home Medications    Prior to Admission medications   Medication Sig Start Date End Date Taking? Authorizing Provider  Cholecalciferol (VITAMIN D3) 50 MCG (2000 UT) CAPS Take 2,000 Units by mouth every other day.   Yes [provider]    Family History Family History  Problem Relation Age of Onset  . Hyperlipidemia Mother   . Hypertension Father   . Arthritis Father   . Diabetes Father   . Aneurysm Maternal Grandmother   . Stroke Maternal Grandmother   . Heart disease Maternal Grandfather   . Miscarriages / Stillbirths Sister   . Cancer Paternal Grandmother        unknown, blood?  . Heart attack Paternal Grandfather 74    Social History Social History   Tobacco Use  . Smoking status: Never Smoker  . Smokeless tobacco: Never Used  Vaping Use  . Vaping Use: Never used  Substance Use Topics  . Alcohol use: No  . Drug use: No     Allergies   Ciprofloxacin hcl, Donnatal  [phenobarbital-belladonna alk], and Levofloxacin   Review of Systems Review of Systems Pertinent  negatives listed in HPI   Physical Exam Triage Vital Signs ED Triage Vitals  Enc Vitals Group     BP 11/23/20 0852 112/78     Pulse Rate 11/23/20 0852 85     Resp 11/23/20 0852 16     Temp 11/23/20 0852 98.8 F (37.1 C)     Temp Source 11/23/20 0852 Oral     SpO2 11/23/20 0852 95 %     Weight --      Height --      Head Circumference --      Peak Flow --      Pain Score 11/23/20 0849 4     Pain Loc --      Pain Edu? --      Excl. in [place]? --    No data found.  Updated Vital Signs BP 112/78 (BP Location: Left Arm)   Pulse 85   Temp 98.8 F (37.1 C) (Oral)   Resp 16   SpO2 95%   Visual Acuity Right Eye Distance:   Left Eye Distance:   Bilateral Distance:    Right Eye Near:   Left Eye Near:    Bilateral Near:     Physical Exam Constitutional:      Appearance: He is well-developed. He is ill-appearing.  HENT:     Head: Normocephalic.  Eyes:     General: Lids are normal.     Extraocular Movements: Extraocular movements intact.  Abdominal:     General: Abdomen is flat. Bowel sounds are decreased.     Palpations: Abdomen is soft.     Tenderness: There is generalized abdominal tenderness.  Skin:    Capillary Refill: Capillary refill takes less than 2 seconds.  Neurological:     General: No focal deficit present.     Mental Status: He is alert and oriented to person, place, and time.  Psychiatric:        Attention and Perception: Attention normal.        Mood and Affect: Mood normal.        Speech: Speech normal.        Behavior: Behavior normal.      UC Treatments / Results  Labs (all labs ordered are listed, but only abnormal results are displayed) Labs Reviewed - No data to display  EKG   Radiology No results found.  Procedures Procedures (including critical care time)  Medications Ordered in UC Medications - No data to display  Initial Impression / Assessment and Plan / UC Course  I have reviewed the triage vital signs and the nursing  notes.  Pertinent labs & imaging results that were available during my care of the patient were reviewed by me and considered in my medical decision making (see chart for details).    Acute viral gastroenteritis. Self limiting illness. No peritoneal signs present during abdominal exam, reassuring for no acute abdominal process. Symptom management, oral rehydration and advance foods as tolerated. Treatment per discharge medication orders. ER precaution if symptoms worsen.  Final Clinical Impressions(s) / UC Diagnoses   Final diagnoses:  Viral gastroenteritis   Discharge Instructions   None    ED Prescriptions    Medication Sig Dispense Auth. Provider   diphenoxylate-atropine (LOMOTIL) 2.5-0.025 MG tablet Take 1-2 tablets by mouth 4 (four) times daily as needed for diarrhea or loose stools. 30 tablet Scot Jun, FNP   famotidine (PEPCID) 40 MG tablet Take 1 tablet (40 mg total) by mouth 2 (two) times daily for 5 days. 10 tablet Scot Jun, FNP   ondansetron (ZOFRAN) 4 MG tablet Take 1 tablet (4 mg total) by mouth every 6 (six) hours. 12 tablet Scot Jun, FNP     PDMP not reviewed this encounter.   Scot Jun, FNP 11/23/20 1021

## 2021-02-21 ENCOUNTER — Ambulatory Visit
Admission: EM | Admit: 2021-02-21 | Discharge: 2021-02-21 | Disposition: A | Discharge status: Home / Self Care | Payer: 59 | Attending: Emergency Medicine | Admitting: Emergency Medicine

## 2021-02-21 ENCOUNTER — Other Ambulatory Visit: Payer: Self-pay

## 2021-02-21 DIAGNOSIS — Z1152 Encounter for screening for COVID-19: Secondary | ICD-10-CM

## 2021-02-21 DIAGNOSIS — J029 Acute pharyngitis, unspecified: Secondary | ICD-10-CM | POA: Diagnosis not present

## 2021-02-21 LAB — POCT RAPID STREP A (OFFICE): Rapid Strep A Screen: NEGATIVE

## 2021-02-21 NOTE — ED Provider Notes (Signed)
[name]    CSN: [id number] Arrival date & time: 02/21/21  1433      History   Chief Complaint Chief Complaint  Patient presents with   Sore Throat    HPI Brian Cox is a 45 y.o. male.  Patient presents with 4-day history of sore throat.  He denies fever, chills, rash, cough, shortness of breath, or other symptoms.  Treatment attempted at home with ibuprofen with good relief of symptom.  The history is provided by the patient.   Past Medical History:  Diagnosis Date   Hyperlipidemia    Prostatitis, acute 2019    Patient Active Problem List   Diagnosis Date Noted   Low vitamin D level 04/05/2020   Hyperlipidemia 09/25/2017   Lipoma of neck 09/25/2017    Past Surgical History:  Procedure Laterality Date   WISDOM TOOTH EXTRACTION         Home Medications    Prior to Admission medications   Medication Sig Start Date End Date Taking? Authorizing Provider  Cholecalciferol (VITAMIN D3) 50 MCG (2000 UT) CAPS Take 2,000 Units by mouth every other day.   Yes [provider]  diphenoxylate-atropine (LOMOTIL) 2.5-0.025 MG tablet Take 1-2 tablets by mouth 4 (four) times daily as needed for diarrhea or loose stools. 11/23/20   Scot Jun, FNP  famotidine (PEPCID) 40 MG tablet Take 1 tablet (40 mg total) by mouth 2 (two) times daily for 5 days. 11/23/20 11/28/20  Scot Jun, FNP  ondansetron (ZOFRAN) 4 MG tablet Take 1 tablet (4 mg total) by mouth every 6 (six) hours. 11/23/20   Scot Jun, FNP    Family History Family History  Problem Relation Age of Onset   Hyperlipidemia Mother    Hypertension Father    Arthritis Father    Diabetes Father    Aneurysm Maternal Grandmother    Stroke Maternal Grandmother    Heart disease Maternal Grandfather    Miscarriages / Stillbirths Sister    Cancer Paternal Grandmother        unknown, blood?   Heart attack Paternal Grandfather 74    Social History Social History   Tobacco Use    Smoking status: Never   Smokeless tobacco: Never  Vaping Use   Vaping Use: Never used  Substance Use Topics   Alcohol use: No   Drug use: No     Allergies   Ciprofloxacin hcl, Donnatal  [phenobarbital-belladonna alk], and Levofloxacin   Review of Systems Review of Systems  Constitutional:  Negative for chills and fever.  HENT:  Positive for sore throat. Negative for congestion and ear pain.   Respiratory:  Negative for cough and shortness of breath.   Cardiovascular:  Negative for chest pain and palpitations.  Gastrointestinal:  Negative for abdominal pain, diarrhea and vomiting.  Skin:  Negative for color change and rash.  All other systems reviewed and are negative.   Physical Exam Triage Vital Signs ED Triage Vitals  Enc Vitals Group     BP 02/21/21 1445 (!) 149/83     Pulse Rate 02/21/21 1445 66     Resp 02/21/21 1445 18     Temp 02/21/21 1445 98.2 F (36.8 C)     Temp src --      SpO2 02/21/21 1445 97 %     Weight --      Height --      Head Circumference --      Peak Flow --  Pain Score 02/21/21 1447 5     Pain Loc --      Pain Edu? --      Excl. in [place]? --    No data found.  Updated Vital Signs BP (!) 149/83 (BP Location: Right Arm)   Pulse 66   Temp 98.2 F (36.8 C)   Resp 18   SpO2 97%   Visual Acuity Right Eye Distance:   Left Eye Distance:   Bilateral Distance:    Right Eye Near:   Left Eye Near:    Bilateral Near:     Physical Exam Vitals and nursing note reviewed.  Constitutional:      General: He is not in acute distress.    Appearance: He is well-developed. He is not ill-appearing.  HENT:     Head: Normocephalic and atraumatic.     Right Ear: Tympanic membrane normal.     Left Ear: Tympanic membrane normal.     Nose: Nose normal.     Mouth/Throat:     Mouth: Mucous membranes are moist.     Pharynx: Posterior oropharyngeal erythema present.     Tonsils: 0 on the right. 0 on the left.  Eyes:     Conjunctiva/sclera:  Conjunctivae normal.  Cardiovascular:     Rate and Rhythm: Normal rate and regular rhythm.     Heart sounds: Normal heart sounds.  Pulmonary:     Effort: Pulmonary effort is normal. No respiratory distress.     Breath sounds: Normal breath sounds.  Abdominal:     Palpations: Abdomen is soft.     Tenderness: There is no abdominal tenderness.  Musculoskeletal:     Cervical back: Neck supple.  Skin:    General: Skin is warm and dry.  Neurological:     General: No focal deficit present.     Mental Status: He is alert and oriented to person, place, and time.     Gait: Gait normal.  Psychiatric:        Mood and Affect: Mood normal.        Behavior: Behavior normal.     UC Treatments / Results  Labs (all labs ordered are listed, but only abnormal results are displayed) Labs Reviewed  NOVEL CORONAVIRUS, NAA  CULTURE, GROUP A STREP [facility])  POCT RAPID STREP A (OFFICE)    EKG   Radiology No results found.  Procedures Procedures (including critical care time)  Medications Ordered in UC Medications - No data to display  Initial Impression / Assessment and Plan / UC Course  I have reviewed the triage vital signs and the nursing notes.  Pertinent labs & imaging results that were available during my care of the patient were reviewed by me and considered in my medical decision making (see chart for details).  Sore throat.  Rapid strep negative; culture pending.  COVID pending.  Instructed patient to self quarantine per CDC guidelines.  Discussed symptomatic treatment including Tylenol or ibuprofen, rest, hydration.  Instructed patient to follow up with PCP if symptoms are not improving.  Patient agrees to plan of care.    Final Clinical Impressions(s) / UC Diagnoses   Final diagnoses:  Encounter for screening for COVID-19  Sore throat     Discharge Instructions      Your rapid strep test is negative.  A throat culture is pending; we will call you if it is positive  requiring treatment.    Follow up with your primary care provider if your symptoms are not  improving.         ED Prescriptions   None    PDMP not reviewed this encounter.   Sharion Balloon, NP 02/21/21 [phone]

## 2021-02-21 NOTE — Discharge Instructions (Addendum)
Your rapid strep test is negative.  A throat culture is pending; we will call you if it is positive requiring treatment.    Follow up with your primary care provider if your symptoms are not improving.    

## 2021-02-21 NOTE — ED Triage Notes (Signed)
Pt presents with L sided sore throat x 4 days.  Denies HA, cough,fever, etc. Home COVID test was negative.

## 2021-02-22 LAB — NOVEL CORONAVIRUS, NAA: SARS-CoV-2, NAA: NOT DETECTED

## 2021-02-22 LAB — SARS-COV-2, NAA 2 DAY TAT

## 2021-02-24 LAB — CULTURE, GROUP A STREP (THRC)

## 2021-04-19 ENCOUNTER — Encounter: Payer: 59 | Admitting: Nurse Practitioner

## 2021-04-26 ENCOUNTER — Encounter: Payer: 59 | Admitting: Family Medicine

## 2021-05-02 ENCOUNTER — Encounter: Payer: Self-pay | Admitting: Family Medicine

## 2021-05-02 ENCOUNTER — Ambulatory Visit (INDEPENDENT_AMBULATORY_CARE_PROVIDER_SITE_OTHER): Payer: 59 | Admitting: Family Medicine

## 2021-05-02 ENCOUNTER — Other Ambulatory Visit: Payer: Self-pay

## 2021-05-02 VITALS — BP 110/80 | HR 71 | Temp 97.0°F | Ht 70.0 in | Wt 169.4 lb

## 2021-05-02 DIAGNOSIS — Z0000 Encounter for general adult medical examination without abnormal findings: Secondary | ICD-10-CM

## 2021-05-02 DIAGNOSIS — Z23 Encounter for immunization: Secondary | ICD-10-CM

## 2021-05-02 DIAGNOSIS — Z113 Encounter for screening for infections with a predominantly sexual mode of transmission: Secondary | ICD-10-CM

## 2021-05-02 DIAGNOSIS — E785 Hyperlipidemia, unspecified: Secondary | ICD-10-CM | POA: Diagnosis not present

## 2021-05-02 NOTE — Progress Notes (Signed)
Annual Exam   Chief Complaint:  Chief Complaint  Patient presents with   Annual Exam    History of Present Illness:  Brian Cox is a 45 y.o. presents today for annual examination.     Nutrition/Lifestyle Diet: tries to eat well, salad, veggies Exercise: exercising regularly He is single partner, contraception - male partner.  Any issues getting or maintaining an erection? no  Social History   Tobacco Use  Smoking Status Never  Smokeless Tobacco Never   Social History   Substance and Sexual Activity  Alcohol Use No   Social History   Substance and Sexual Activity  Drug Use No     Safety The patient wears seatbelts: yes.     The patient feels safe at home and in their relationships: yes.  General Health Dentist in the last year: Yes Eye doctor: yes  Weight Wt Readings from Last 3 Encounters:  05/02/21 169 lb 6 oz (76.8 kg)  09/26/20 171 lb 4 oz (77.7 kg)  04/19/20 173 lb (78.5 kg)   Patient has normal BMI  BMI Readings from Last 1 Encounters:  05/02/21 24.30 kg/m     Chronic disease screening Blood pressure monitoring:  BP Readings from Last 3 Encounters:  05/02/21 110/80  02/21/21 (!) 149/83  11/23/20 112/78    Lipid Monitoring: Indication for screening: age >35, obesity, diabetes, family hx, CV risk factors.  Lipid screening: Yes  Lab Results  Component Value Date   CHOL 162 04/13/2020   HDL 37.90 (L) 04/13/2020   LDLCALC 111 (H) 04/13/2020   TRIG 65.0 04/13/2020   CHOLHDL 4 04/13/2020     Diabetes Screening: age >20, overweight, family hx, PCOS, hx of gestational diabetes, at risk ethnicity, elevated blood pressure >135/80.  Diabetes Screening screening: Yes  Lab Results  Component Value Date   HGBA1C 5.5 04/13/2020     Immunization History  Administered Date(s) Administered   DTaP 02/15/1977, 04/16/1977, 06/18/1977, 06/18/1978, 03/14/1982   IPV 02/15/1977, 04/16/1977, 06/18/1978, 03/14/1982   Influenza Inj Mdck Quad Pf  06/06/2018   Influenza,inj,Quad PF,6+ Mos 06/08/2015, 06/06/2017, 05/26/2019, 04/19/2020, 05/02/2021   MMR 03/21/1978, 01/18/1997   PFIZER(Purple Top)SARS-COV-2 Vaccination 10/22/2019, 11/12/2019, 06/14/2020   Td 01/18/1997   Tdap 04/01/2015, 03/23/2020    Past Medical History:  Diagnosis Date   Hyperlipidemia    Prostatitis, acute 2019    Past Surgical History:  Procedure Laterality Date   WISDOM TOOTH EXTRACTION      Prior to Admission medications   Medication Sig Start Date End Date Taking? Authorizing Provider  Cholecalciferol (VITAMIN D3) 50 MCG (2000 UT) CAPS Take 2,000 Units by mouth every other day.   Yes [provider]  Ascorbic Acid (VITAMIN C) 500 MG CAPS Take 500 mg by mouth daily at 12 noon.    [provider]  fluticasone (FLONASE) 50 MCG/ACT nasal spray Place 2 sprays into both nostrils daily at 12 noon.    [provider]  Zinc 30 MG CAPS Take 30 mg by mouth daily at 12 noon.    [provider]    Allergies  Allergen Reactions   Ciprofloxacin Hcl Swelling   Donnatal  [Phenobarbital-Belladonna Alk] Swelling   Levofloxacin Swelling     Social History   Socioeconomic History   Marital status: Single    Spouse name: Not on file   Number of children: 0   Years of education: Not on file   Highest education level: Doctorate  Occupational History   Not on file  Tobacco Use   Smoking status: Never   Smokeless tobacco: Never  Vaping Use   Vaping Use: Never used  Substance and Sexual Activity   Alcohol use: No   Drug use: No   Sexual activity: Yes  Other Topics Concern   Not on file  Social History Narrative   09/26/20   From: [place], [place], moved for work   Living: alone   Work: Phd in Print production planner at [employer]      Family: family in [place], good relationship      Enjoys: music - piano, being outside, hiking, running, walking, playing tennis, traveling      Exercise: running  [place] 3-5 times a week   Diet: limits meat, has a sweet tooth      Safety   Seat belts: Yes    Guns: No   Safe in relationships: Yes    Social Determinants of Radio broadcast assistant Strain: Not on file  Food Insecurity: Not on file  Transportation Needs: Not on file  Physical Activity: Not on file  Stress: Not on file  Social Connections: Not on file  Intimate Partner Violence: Not on file    Family History  Problem Relation Age of Onset   Hyperlipidemia Mother    Hypertension Father    Arthritis Father    Diabetes Father    Aneurysm Maternal Grandmother    Stroke Maternal Grandmother    Heart disease Maternal Grandfather    Miscarriages / Stillbirths Sister    Cancer Paternal Grandmother        unknown, blood?   Heart attack Paternal Grandfather 25    Review of Systems  Constitutional:  Negative for chills and fever.  HENT:  Negative for congestion and sore throat.   Eyes:  Negative for blurred vision and double vision.  Respiratory:  Negative for shortness of breath.   Cardiovascular:  Negative for chest pain.  Gastrointestinal:  Negative for heartburn, nausea and vomiting.  Genitourinary: Negative.   Musculoskeletal: Negative.  Negative for myalgias.  Skin:  Negative for rash.  Neurological:  Negative for dizziness and headaches.  Endo/Heme/Allergies:  Does not bruise/bleed easily.  Psychiatric/Behavioral:  Negative for depression. The patient is not nervous/anxious.     Physical Exam BP 110/80   Pulse 71   Temp (!) 97 F (36.1 C) (Temporal)   Ht '5\' 10"'  (1.778 m)   Wt 169 lb 6 oz (76.8 kg)   SpO2 98%   BMI 24.30 kg/m    BP Readings from Last 3 Encounters:  05/02/21 110/80  02/21/21 (!) 149/83  11/23/20 112/78      Physical Exam Constitutional:      General: He is not in acute distress.    Appearance: He is well-developed. He is not diaphoretic.  HENT:     Head: Normocephalic and atraumatic.     Right Ear: Tympanic membrane and  ear canal normal.     Left Ear: Tympanic membrane and ear canal normal.     Nose: Nose normal.     Mouth/Throat:     Pharynx: Uvula midline.  Eyes:     General: No scleral icterus.    Conjunctiva/sclera: Conjunctivae normal.     Pupils: Pupils are equal, round, and reactive to light.  Cardiovascular:     Rate and Rhythm: Normal rate and regular rhythm.     Heart sounds: Normal heart sounds. No murmur heard. Pulmonary:     Effort: Pulmonary effort is normal.  No respiratory distress.     Breath sounds: Normal breath sounds. No wheezing.  Abdominal:     General: Bowel sounds are normal. There is no distension.     Palpations: Abdomen is soft. There is no mass.     Tenderness: There is no abdominal tenderness. There is no guarding.  Musculoskeletal:        General: Normal range of motion.     Cervical back: Normal range of motion and neck supple.  Lymphadenopathy:     Cervical: No cervical adenopathy.  Skin:    General: Skin is warm and dry.     Capillary Refill: Capillary refill takes less than 2 seconds.  Neurological:     Mental Status: He is alert and oriented to person, place, and time.       Results:  PHQ-9:  [place] Office Visit from 04/05/2020 in [facility]  PHQ-9 Total Score 0         Assessment: 45 y.o. here for routine annual physical examination.  Plan: Problem List Items Addressed This Visit       Other   Hyperlipidemia   Relevant Orders   Lipid panel   Other Visit Diagnoses     Annual physical exam    -  Primary   Relevant Orders   Lipid panel   Comprehensive metabolic panel   Need for influenza vaccination       Relevant Orders   Flu Vaccine QUAD 49moIM (Fluarix, Fluzone & Alfiuria Quad PF) (Completed)   Routine screening for STI (sexually transmitted infection)       Relevant Orders   HIV Antibody (routine testing w rflx)   RPR   Hepatitis C antibody   C. trachomatis/N. gonorrhoeae RNA        Screening: -- Blood pressure screen normal -- cholesterol screening: will obtain -- Weight screening: normal -- Diabetes Screening: will obtain -- Nutrition: Encouraged healthy diet and exercise  The 10-year ASCVD risk score (Arnett DK, et al., 2019) is: 1.4%   Values used to calculate the score:     Age: [age]years     Sex: Male     Is Non-Hispanic African American: No     Diabetic: No     Tobacco smoker: No     Systolic Blood Pressure: 1037mmHg     Is BP treated: No     HDL Cholesterol: 37.9 mg/dL     Total Cholesterol: 162 mg/dL  -- Statin therapy for Age 44-75with CVD risk >7.5%  Psych -- Depression screening (PHQ-9):  [place] Total Score 0        Safety -- tobacco screening: not using -- alcohol screening:  low-risk usage. -- no evidence of domestic violence or intimate partner violence.   Cancer Screening -- No age related cancer screening due  Immunizations Immunization History  Administered Date(s) Administered   DTaP 02/15/1977, 04/16/1977, 06/18/1977, 06/18/1978, 03/14/1982   IPV 02/15/1977, 04/16/1977, 06/18/1978, 03/14/1982   Influenza Inj Mdck Quad Pf 06/06/2018   Influenza,inj,Quad PF,6+ Mos 06/08/2015, 06/06/2017, 05/26/2019, 04/19/2020, 05/02/2021   MMR 03/21/1978, 01/18/1997   PFIZER(Purple Top)SARS-COV-2 Vaccination 10/22/2019, 11/12/2019, 06/14/2020   Td 01/18/1997   Tdap 04/01/2015, 03/23/2020    -- flu vaccine up to date -- TDAP q10 years up to date -- Covid-19 Vaccine not up to date - will get new booster   Encouraged regular vision and dental screening. Encouraged healthy exercise and  diet.   Lesleigh Noe

## 2021-05-02 NOTE — Patient Instructions (Signed)
Get your covid shot in 2 weeks  Preventive Care 25-44 Years Old, Male Preventive care refers to lifestyle choices and visits with your health care provider that can promote health and wellness. This includes: A yearly physical exam. This is also called an annual wellness visit. Regular dental and eye exams. Immunizations. Screening for certain conditions. Healthy lifestyle choices, such as: Eating a healthy diet. Getting regular exercise. Not using drugs or products that contain nicotine and tobacco. Limiting alcohol use. What can I expect for my preventive care visit? Physical exam Your health care provider may check your: Height and weight. These may be used to calculate your BMI (body mass index). BMI is a measurement that tells if you are at a healthy weight. Heart rate and blood pressure. Body temperature. Skin for abnormal spots. Counseling Your health care provider may ask you questions about your: Past medical problems. Family's medical history. Alcohol, tobacco, and drug use. Emotional well-being. Home life and relationship well-being. Sexual activity. Diet, exercise, and sleep habits. Work and work Statistician. Access to firearms. What immunizations do I need? Vaccines are usually given at various ages, according to a schedule. Your health care provider will recommend vaccines for you based on your age, medical history, and lifestyle or other factors, such as travel or where you work. What tests do I need? Blood tests Lipid and cholesterol levels. These may be checked every 5 years starting at age 43. Hepatitis C test. Hepatitis B test. Screening  Diabetes screening. This is done by checking your blood sugar (glucose) after you have not eaten for a while (fasting). Genital exam to check for testicular cancer or hernias. STD (sexually transmitted disease) testing, if you are at risk. Talk with your health care provider about your test results, treatment options,  and if necessary, the need for more tests. Follow these instructions at home: Eating and drinking  Eat a healthy diet that includes fresh fruits and vegetables, whole grains, lean protein, and low-fat dairy products. Drink enough fluid to keep your urine pale yellow. Take vitamin and mineral supplements as recommended by your health care provider. Do not drink alcohol if your health care provider tells you not to drink. If you drink alcohol: Limit how much you have to 0-2 drinks a day. Be aware of how much alcohol is in your drink. In the [place] one drink equals one 12 oz bottle of beer (355 mL), one 5 oz glass of wine (148 mL), or one 1 oz glass of hard liquor (44 mL). Lifestyle Take daily care of your teeth and gums. Brush your teeth every morning and night with fluoride toothpaste. Floss one time each day. Stay active. Exercise for at least 30 minutes 5 or more days each week. Do not use any products that contain nicotine or tobacco, such as cigarettes, e-cigarettes, and chewing tobacco. If you need help quitting, ask your health care provider. Do not use drugs. If you are sexually active, practice safe sex. Use a condom or other form of protection to prevent STIs (sexually transmitted infections). Find healthy ways to cope with stress, such as: Meditation, yoga, or listening to music. Journaling. Talking to a trusted person. Spending time with friends and family. Safety Always wear your seat belt while driving or riding in a vehicle. Do not drive: If you have been drinking alcohol. Do not ride with someone who has been drinking. When you are tired or distracted. While texting. Wear a helmet and other protective equipment during sports activities. If  you have firearms in your house, make sure you follow all gun safety procedures. Seek help if you have been physically or sexually abused. What's next? Go to your health care provider once a year for an annual wellness visit. Ask your  health care provider how often you should have your eyes and teeth checked. Stay up to date on all vaccines. This information is not intended to replace advice given to you by your health care provider. Make sure you discuss any questions you have with your health care provider. Document Revised: 10/14/2020 Document Reviewed: 07/31/2018 Elsevier Patient Education  2022 [employer].

## 2021-05-03 LAB — COMPREHENSIVE METABOLIC PANEL
ALT: 22 U/L (ref 0–53)
AST: 20 U/L (ref 0–37)
Albumin: 4.4 g/dL (ref 3.5–5.2)
Alkaline Phosphatase: 62 U/L (ref 39–117)
BUN: 13 mg/dL (ref 6–23)
CO2: 26 mEq/L (ref 19–32)
Calcium: 9.6 mg/dL (ref 8.4–10.5)
Chloride: 103 mEq/L (ref 96–112)
Creatinine, Ser: 0.96 mg/dL (ref 0.40–1.50)
GFR: 96.22 mL/min (ref >60.00)
Glucose, Bld: 77 mg/dL (ref 70–99)
Potassium: 4 mEq/L (ref 3.5–5.1)
Sodium: 138 mEq/L (ref 135–145)
Total Bilirubin: 0.6 mg/dL (ref 0.2–1.2)
Total Protein: 7.4 g/dL (ref 6.0–8.3)

## 2021-05-03 LAB — HIV ANTIBODY (ROUTINE TESTING W REFLEX): HIV 1&2 Ab, 4th Generation: NONREACTIVE

## 2021-05-03 LAB — RPR: RPR Ser Ql: NONREACTIVE

## 2021-05-03 LAB — LIPID PANEL
Cholesterol: 180 mg/dL (ref 0–200)
HDL: 39.1 mg/dL (ref >39.00)
LDL Cholesterol: 126 mg/dL — ABNORMAL HIGH (ref 0–99)
NonHDL: 141.36
Total CHOL/HDL Ratio: 5
Triglycerides: 75 mg/dL (ref 0.0–149.0)
VLDL: 15 mg/dL (ref 0.0–40.0)

## 2021-05-03 LAB — HEPATITIS C ANTIBODY
Hepatitis C Ab: NONREACTIVE
SIGNAL TO CUT-OFF: 0.01 (ref <1.00)

## 2021-05-03 LAB — C. TRACHOMATIS/N. GONORRHOEAE RNA
C. trachomatis RNA, TMA: NOT DETECTED
N. gonorrhoeae RNA, TMA: NOT DETECTED

## 2021-08-04 ENCOUNTER — Ambulatory Visit
Admission: EM | Admit: 2021-08-04 | Discharge: 2021-08-04 | Disposition: A | Discharge status: Home / Self Care | Payer: 59 | Attending: Emergency Medicine | Admitting: Emergency Medicine

## 2021-08-04 ENCOUNTER — Encounter: Payer: Self-pay | Admitting: Emergency Medicine

## 2021-08-04 ENCOUNTER — Other Ambulatory Visit: Payer: Self-pay

## 2021-08-04 DIAGNOSIS — J069 Acute upper respiratory infection, unspecified: Secondary | ICD-10-CM

## 2021-08-04 NOTE — Discharge Instructions (Addendum)
Your COVID and Flu tests are pending.  You should self quarantine until the test results are back.    Take Tylenol or ibuprofen as needed for fever or discomfort.  Rest and keep yourself hydrated.    Follow-up with your primary care provider if your symptoms are not improving.     

## 2021-08-04 NOTE — ED Triage Notes (Addendum)
Pt c/o runny nose, cough, bilateral ear pain and ST x 4 days

## 2021-08-04 NOTE — ED Provider Notes (Signed)
[name]    CSN: [id number] Arrival date & time: 08/04/21  0813      History   Chief Complaint Chief Complaint  Patient presents with   Nasal Congestion   Sore Throat   Cough     HPI Luc Shammas is a 45 y.o. male.  Patient presents with 3 to 4 day history of congestion, sore throat, cough.  He has green nasal mucous.  No fever, rash, shortness of breath, vomiting, diarrhea or abdominal symptoms.  Treatment at home with ibuprofen, Flonase, vitamins.  The history is provided by the patient and medical records.   Past Medical History:  Diagnosis Date   Hyperlipidemia    Prostatitis, acute 2019    Patient Active Problem List   Diagnosis Date Noted   Low vitamin D level 04/05/2020   Hyperlipidemia 09/25/2017   Lipoma of neck 09/25/2017    Past Surgical History:  Procedure Laterality Date   WISDOM TOOTH EXTRACTION         Home Medications    Prior to Admission medications   Medication Sig Start Date End Date Taking? Authorizing Provider  Ascorbic Acid (VITAMIN C) 500 MG CAPS Take 500 mg by mouth daily at 12 noon.    [provider]  Cholecalciferol (VITAMIN D3) 50 MCG (2000 UT) CAPS Take 2,000 Units by mouth every other day.    [provider]  fluticasone (FLONASE) 50 MCG/ACT nasal spray Place 2 sprays into both nostrils daily at 12 noon.    [provider]  Zinc 30 MG CAPS Take 30 mg by mouth daily at 12 noon.    [provider]    Family History Family History  Problem Relation Age of Onset   Hyperlipidemia Mother    Hypertension Father    Arthritis Father    Diabetes Father    Aneurysm Maternal Grandmother    Stroke Maternal Grandmother    Heart disease Maternal Grandfather    Miscarriages / Stillbirths Sister    Cancer Paternal Grandmother        unknown, blood?   Heart attack Paternal Grandfather 74    Social History Social History   Tobacco Use   Smoking status: Never   Smokeless  tobacco: Never  Vaping Use   Vaping Use: Never used  Substance Use Topics   Alcohol use: No   Drug use: No     Allergies   Ciprofloxacin hcl, Donnatal  [phenobarbital-belladonna alk], and Levofloxacin   Review of Systems Review of Systems  Constitutional:  Negative for chills and fever.  HENT:  Positive for congestion and sore throat. Negative for ear pain.   Respiratory:  Positive for cough. Negative for shortness of breath.   Cardiovascular:  Negative for chest pain and palpitations.  Gastrointestinal:  Negative for diarrhea and vomiting.  Skin:  Negative for color change and rash.  All other systems reviewed and are negative.   Physical Exam Triage Vital Signs ED Triage Vitals  Enc Vitals Group     BP      Pulse      Resp      Temp      Temp src      SpO2      Weight      Height      Head Circumference      Peak Flow      Pain Score      Pain Loc      Pain Edu?  Excl. in [place]?    No data found.  Updated Vital Signs BP 124/82 (BP Location: Right Arm)    Pulse 70    Temp 98 F (36.7 C) (Oral)    Resp 16    SpO2 96%   Visual Acuity Right Eye Distance:   Left Eye Distance:   Bilateral Distance:    Right Eye Near:   Left Eye Near:    Bilateral Near:     Physical Exam Vitals and nursing note reviewed.  Constitutional:      General: He is not in acute distress.    Appearance: He is well-developed. He is not ill-appearing.  HENT:     Right Ear: Tympanic membrane normal.     Left Ear: Tympanic membrane normal.     Nose: Nose normal.     Mouth/Throat:     Mouth: Mucous membranes are moist.     Pharynx: Oropharynx is clear.  Cardiovascular:     Rate and Rhythm: Normal rate and regular rhythm.     Heart sounds: Normal heart sounds.  Pulmonary:     Effort: Pulmonary effort is normal. No respiratory distress.     Breath sounds: Normal breath sounds.  Musculoskeletal:     Cervical back: Neck supple.  Skin:    General: Skin is warm and dry.   Neurological:     Mental Status: He is alert.  Psychiatric:        Mood and Affect: Mood normal.        Behavior: Behavior normal.     UC Treatments / Results  Labs (all labs ordered are listed, but only abnormal results are displayed) Labs Reviewed  COVID-19, FLU A+B NAA    EKG   Radiology No results found.  Procedures Procedures (including critical care time)  Medications Ordered in UC Medications - No data to display  Initial Impression / Assessment and Plan / UC Course  I have reviewed the triage vital signs and the nursing notes.  Pertinent labs & imaging results that were available during my care of the patient were reviewed by me and considered in my medical decision making (see chart for details).   Viral URI.  COVID and Flu pending.  Instructed patient to self quarantine per CDC guidelines.  Discussed symptomatic treatment including Tylenol or ibuprofen, rest, hydration.  Instructed patient to follow up with PCP if symptoms are not improving.  Patient agrees to plan of care.    Final Clinical Impressions(s) / UC Diagnoses   Final diagnoses:  Viral URI     Discharge Instructions      Your COVID and Flu tests are pending.  You should self quarantine until the test results are back.    Take Tylenol or ibuprofen as needed for fever or discomfort.  Rest and keep yourself hydrated.    Follow-up with your primary care provider if your symptoms are not improving.         ED Prescriptions   None    PDMP not reviewed this encounter.   Sharion Balloon, NP 08/04/21 [phone]

## 2021-08-05 LAB — COVID-19, FLU A+B NAA
Influenza A, NAA: NOT DETECTED
Influenza B, NAA: NOT DETECTED
SARS-CoV-2, NAA: NOT DETECTED

## 2022-05-03 ENCOUNTER — Encounter: Payer: 59 | Admitting: Family Medicine

## 2022-05-04 ENCOUNTER — Ambulatory Visit (INDEPENDENT_AMBULATORY_CARE_PROVIDER_SITE_OTHER): Payer: 59 | Admitting: Family Medicine

## 2022-05-04 ENCOUNTER — Encounter: Payer: Self-pay | Admitting: Family Medicine

## 2022-05-04 VITALS — BP 100/60 | HR 74 | Temp 97.0°F | Ht 70.5 in | Wt 171.2 lb

## 2022-05-04 DIAGNOSIS — R7989 Other specified abnormal findings of blood chemistry: Secondary | ICD-10-CM

## 2022-05-04 DIAGNOSIS — Z1211 Encounter for screening for malignant neoplasm of colon: Secondary | ICD-10-CM

## 2022-05-04 DIAGNOSIS — E785 Hyperlipidemia, unspecified: Secondary | ICD-10-CM | POA: Diagnosis not present

## 2022-05-04 DIAGNOSIS — Z23 Encounter for immunization: Secondary | ICD-10-CM

## 2022-05-04 DIAGNOSIS — Z0000 Encounter for general adult medical examination without abnormal findings: Secondary | ICD-10-CM

## 2022-05-04 LAB — COMPREHENSIVE METABOLIC PANEL
ALT: 32 U/L (ref 0–53)
AST: 25 U/L (ref 0–37)
Albumin: 4.3 g/dL (ref 3.5–5.2)
Alkaline Phosphatase: 62 U/L (ref 39–117)
BUN: 12 mg/dL (ref 6–23)
CO2: 28 mEq/L (ref 19–32)
Calcium: 9.2 mg/dL (ref 8.4–10.5)
Chloride: 103 mEq/L (ref 96–112)
Creatinine, Ser: 1.1 mg/dL (ref 0.40–1.50)
GFR: 81.14 mL/min (ref >60.00)
Glucose, Bld: 81 mg/dL (ref 70–99)
Potassium: 4.5 mEq/L (ref 3.5–5.1)
Sodium: 137 mEq/L (ref 135–145)
Total Bilirubin: 0.6 mg/dL (ref 0.2–1.2)
Total Protein: 7.5 g/dL (ref 6.0–8.3)

## 2022-05-04 LAB — LIPID PANEL
Cholesterol: 180 mg/dL (ref 0–200)
HDL: 39.5 mg/dL (ref >39.00)
LDL Cholesterol: 124 mg/dL — ABNORMAL HIGH (ref 0–99)
NonHDL: 140.17
Total CHOL/HDL Ratio: 5
Triglycerides: 82 mg/dL (ref 0.0–149.0)
VLDL: 16.4 mg/dL (ref 0.0–40.0)

## 2022-05-04 LAB — VITAMIN D 25 HYDROXY (VIT D DEFICIENCY, FRACTURES): VITD: 33.2 ng/mL (ref 30.00–100.00)

## 2022-05-04 NOTE — Progress Notes (Signed)
Annual Exam   Chief Complaint:  Chief Complaint  Patient presents with   Annual Exam    No concerns    History of Present Illness:  Brian Cox is a 46 y.o. presents today for annual examination.     Nutrition/Lifestyle Diet: fruits and veggies as much as possible, limits fried foods, chicken and fish Exercise: running 3 miles per day He is has sex with males.  Any issues with getting or keeping erection? No  Social History   Tobacco Use  Smoking Status Never  Smokeless Tobacco Never   Social History   Substance and Sexual Activity  Alcohol Use No   Social History   Substance and Sexual Activity  Drug Use No     Safety The patient wears seatbelts: yes.     The patient feels safe at home and in their relationships: yes.  General Health Dentist in the last year: Yes Eye doctor: yes  Weight Wt Readings from Last 3 Encounters:  05/04/22 171 lb 4 oz (77.7 kg)  05/02/21 169 lb 6 oz (76.8 kg)  09/26/20 171 lb 4 oz (77.7 kg)   Patient has normal BMI  BMI Readings from Last 1 Encounters:  05/04/22 24.22 kg/m     Chronic disease screening Blood pressure monitoring:  BP Readings from Last 3 Encounters:  05/04/22 100/60  08/04/21 124/82  05/02/21 110/80    Lipid Monitoring: Indication for screening: age >35, obesity, diabetes, family hx, CV risk factors.  Lipid screening: Yes  Lab Results  Component Value Date   CHOL 180 05/02/2021   HDL 39.10 05/02/2021   LDLCALC 126 (H) 05/02/2021   TRIG 75.0 05/02/2021   CHOLHDL 5 05/02/2021     Diabetes Screening: age >78, overweight, family hx, PCOS, hx of gestational diabetes, at risk ethnicity, elevated blood pressure >135/80.  Diabetes Screening screening: Yes  Lab Results  Component Value Date   HGBA1C 5.5 04/13/2020      Colon Cancer Screening:  Age 20-75 yo - benefits outweigh the risk. Adults 77-85 yo who have never been screened benefit.  Benefits: 134000 people in 2016 will be diagnosed  and 49,000 will die - early detection helps [place]: Complications 2/2 to colonoscopy High Risk (Colonoscopy): genetic disorder (Lynch syndrome or familial adenomatous polyposis), personal hx of IBD, previous adenomatous polyp, or previous colorectal cancer, FamHx start 10 years before the age at diagnosis, increased in males and black race  Options:  FIT - looks for hemoglobin (blood in the stool) - specific and fairly sensitive - must be done annually Cologuard - looks for DNA and blood - more sensitive - therefore can have more false positives, every 3 years Colonoscopy - every 10 years if normal - sedation, bowl prep, must have someone drive you  Shared decision making and the patient had decided to do FOBT.   Social History   Tobacco Use  Smoking Status Never  Smokeless Tobacco Never    Lung Cancer Screening (Ages 26-37): not applicable    Past Medical History:  Diagnosis Date   Hyperlipidemia    Prostatitis, acute 2019    Past Surgical History:  Procedure Laterality Date   WISDOM TOOTH EXTRACTION      Prior to Admission medications   Medication Sig Start Date End Date Taking? Authorizing Provider  Ascorbic Acid (VITAMIN C) 500 MG CAPS Take 500 mg by mouth daily at 12 noon.   Yes [provider]  Cholecalciferol (VITAMIN D3) 50 MCG (2000 UT) CAPS Take 2,000 Units  by mouth every other day.   Yes [provider]  fluticasone (FLONASE) 50 MCG/ACT nasal spray Place 2 sprays into both nostrils daily at 12 noon.   Yes [provider]  Zinc 30 MG CAPS Take 30 mg by mouth daily at 12 noon.   Yes [provider]    Allergies  Allergen Reactions   Ciprofloxacin Hcl Swelling   Donnatal  [Phenobarbital-Belladonna Alk] Swelling   Levofloxacin Swelling     Social History   Socioeconomic History   Marital status: Single    Spouse name: Not on file   Number of children: 0   Years of education: Not on file   Highest education level:  Doctorate  Occupational History   Not on file  Tobacco Use   Smoking status: Never   Smokeless tobacco: Never  Vaping Use   Vaping Use: Never used  Substance and Sexual Activity   Alcohol use: No   Drug use: No   Sexual activity: Yes    Comment: male partner  Other Topics Concern   Not on file  Social History Narrative   09/26/20   From: [place], [place], moved for work   Living: alone   Work: Phd in Print production planner at [employer]      Family: family in [place], good relationship      Enjoys: music - piano, being outside, hiking, running, walking, playing tennis, traveling      Exercise: running [place] 3-5 times a week   Diet: limits meat, has a sweet tooth      Safety   Seat belts: Yes    Guns: No   Safe in relationships: Yes    Social Determinants of Radio broadcast assistant Strain: Not on file  Food Insecurity: Not on file  Transportation Needs: Not on file  Physical Activity: Not on file  Stress: Not on file  Social Connections: Not on file  Intimate Partner Violence: Not on file    Family History  Problem Relation Age of Onset   Hyperlipidemia Mother    Hypertension Father    Arthritis Father    Diabetes Father    Aneurysm Maternal Grandmother    Stroke Maternal Grandmother    Heart disease Maternal Grandfather    Miscarriages / Stillbirths Sister    Cancer Paternal Grandmother        unknown, blood?   Heart attack Paternal Grandfather 18    Review of Systems  Constitutional:  Negative for chills and fever.  HENT:  Negative for congestion and sore throat.   Eyes:  Negative for blurred vision and double vision.  Respiratory:  Negative for shortness of breath.   Cardiovascular:  Negative for chest pain.  Gastrointestinal:  Negative for heartburn, nausea and vomiting.  Genitourinary: Negative.   Musculoskeletal: Negative.  Negative for myalgias.  Skin:  Negative for rash.  Neurological:  Negative for dizziness and  headaches.  Endo/Heme/Allergies:  Does not bruise/bleed easily.  Psychiatric/Behavioral:  Negative for depression. The patient is not nervous/anxious.      Physical Exam BP 100/60   Pulse 74   Temp (!) 97 F (36.1 C) (Temporal)   Ht 5' 10.5" (1.791 m)   Wt 171 lb 4 oz (77.7 kg)   SpO2 97%   BMI 24.22 kg/m    BP Readings from Last 3 Encounters:  05/04/22 100/60  08/04/21 124/82  05/02/21 110/80      Physical Exam Constitutional:      General:  He is not in acute distress.    Appearance: He is well-developed. He is not diaphoretic.  HENT:     Head: Normocephalic and atraumatic.     Right Ear: Tympanic membrane and ear canal normal.     Left Ear: Tympanic membrane and ear canal normal.     Nose: Nose normal.     Mouth/Throat:     Pharynx: Uvula midline.  Eyes:     General: No scleral icterus.    Conjunctiva/sclera: Conjunctivae normal.     Pupils: Pupils are equal, round, and reactive to light.  Cardiovascular:     Rate and Rhythm: Normal rate and regular rhythm.     Heart sounds: Normal heart sounds. No murmur heard. Pulmonary:     Effort: Pulmonary effort is normal. No respiratory distress.     Breath sounds: Normal breath sounds. No wheezing.  Abdominal:     General: Bowel sounds are normal. There is no distension.     Palpations: Abdomen is soft. There is no mass.     Tenderness: There is no abdominal tenderness. There is no guarding.  Musculoskeletal:        General: Normal range of motion.     Cervical back: Normal range of motion and neck supple.  Lymphadenopathy:     Cervical: No cervical adenopathy.  Skin:    General: Skin is warm and dry.     Capillary Refill: Capillary refill takes less than 2 seconds.  Neurological:     Mental Status: He is alert and oriented to person, place, and time.        Results:  PHQ-9:  [place] Office Visit from 04/05/2020 in [facility]  PHQ-9 Total Score 0         Assessment: 46  y.o. here for routine annual physical examination.  Plan: Problem List Items Addressed This Visit       Other   Hyperlipidemia   Relevant Orders   Lipid panel   Comprehensive metabolic panel   Low vitamin D level   Relevant Orders   Vitamin D, 25-hydroxy   Other Visit Diagnoses     Annual physical exam    -  Primary   Need for influenza vaccination       Relevant Orders   Flu Vaccine QUAD 52moIM (Fluarix, Fluzone & Alfiuria Quad PF) (Completed)   Screening for colon cancer       Relevant Orders   Fecal occult blood, imunochemical       Screening: -- Blood pressure screen normal -- cholesterol screening: will obtain -- Weight screening: normal -- Diabetes Screening: will obtain -- Nutrition: normal - Encouraged healthy diet  The 10-year ASCVD risk score (Arnett DK, et al., 2019) is: 1.5%   Values used to calculate the score:     Age: [age]years     Sex: Male     Is Non-Hispanic African American: No     Diabetic: No     Tobacco smoker: No     Systolic Blood Pressure: 1110mmHg     Is BP treated: No     HDL Cholesterol: 39.1 mg/dL     Total Cholesterol: 180 mg/dL  -- ASA 81 mg discussed if CVD risk >10% age 45-59and willing to take for 10 years -- Statin therapy for Age 45-75with CVD risk >7.5%  Psych -- Depression screening (PHQ-9): negative  Safety -- tobacco screening: not using -- alcohol screening:  low-risk usage. -- no evidence of  domestic violence or intimate partner violence.  Cancer Screening -- Prostate (age 17-69) not indicated -- Colon (age 53-75) ordered -- Lung not indicated   Immunizations Immunization History  Administered Date(s) Administered   DTaP 02/15/1977, 04/16/1977, 06/18/1977, 06/18/1978, 03/14/1982   IPV 02/15/1977, 04/16/1977, 06/18/1978, 03/14/1982   Influenza Inj Mdck Quad Pf 06/06/2018   Influenza,inj,Quad PF,6+ Mos 06/08/2015, 06/06/2017, 05/26/2019, 04/19/2020, 05/02/2021, 05/04/2022   MMR 03/21/1978, 01/18/1997    PFIZER(Purple Top)SARS-COV-2 Vaccination 10/22/2019, 11/12/2019, 06/14/2020   Td 01/18/1997   Tdap 04/01/2015, 03/23/2020    -- flu vaccine up to date -- TDAP q10 years up to date -- -- Covid-19 Vaccine up to date  Encouraged regular vision and dental screening. Encouraged healthy exercise and diet.   Lesleigh Noe

## 2022-05-04 NOTE — Patient Instructions (Signed)
Stool test  Return next year

## 2022-05-08 ENCOUNTER — Other Ambulatory Visit: Payer: Self-pay | Admitting: Radiology

## 2022-05-08 DIAGNOSIS — Z1211 Encounter for screening for malignant neoplasm of colon: Secondary | ICD-10-CM

## 2022-05-09 LAB — FECAL OCCULT BLOOD, IMMUNOCHEMICAL: Fecal Occult Bld: NEGATIVE

## 2022-06-11 ENCOUNTER — Telehealth: Payer: Self-pay

## 2022-06-11 NOTE — Telephone Encounter (Signed)
I spoke with pt;pt said it would be difficult to go to UC due to work; pt said about one month ago started with irregular heart beat on and off. Irregular heart beat may be slightly worse with activity. No CP,DOB,H/A or dizziness. Pt does not drink caffeine; pt only drinks water and lemonade. Pt said no more stress than usual. Pt scheduled appt with Dr Silvio Pate on 06/12/22 at 7:45 AM with UC & ED instructions and pt voiced understanding. On 05/04/22 pt had annual exam and pt was not having any irregular heart beat then. Sending note to Dr Silvio Pate.

## 2022-06-11 NOTE — Telephone Encounter (Signed)
[facility] Day - Client TELEPHONE ADVICE RECORD [facility] Patient Name: Brian Cox Gender: Male DOB: Oct 25, 1975 Age: 46 Y 5 M 27 D Return Phone Number: [phone] (Primary) Address: City/ State/ Zip: [place]  [address] Client [facility] Day - Client Client Site [place] - Day Provider Eliezer Lofts - MD Contact Type Call Who Is Calling Patient / Member / Family / Caregiver Call Type Triage / Clinical Relationship To Patient Self Return Phone Number [phone] (Primary) Chief Complaint Heart palpitations or irregular heartbeat Reason for Call Symptomatic / Request for [place] is having an irregular heart beat. [place]. Translation No Nurse Assessment Nurse: Joya Gaskins, RN, Scott Date/Time (Eastern Time): 06/11/2022 2:39:10 PM Confirm and document reason for call. If symptomatic, describe symptoms. ---Caller is having an irregular heart beat off and on for one week. states feels skipped beats. states no other symptoms just the irregular heart beat. Does the patient have any new or worsening symptoms? ---Yes Will a triage be completed? ---Yes Related visit to physician within the last 2 weeks? ---No Does the PT have any chronic conditions? (i.e. diabetes, asthma, this includes High risk factors for pregnancy, etc.) ---No Is this a behavioral health or substance abuse call? ---No Guidelines Guideline Title Affirmed Question Affirmed Notes Nurse Date/Time (Eastern Time) Heart Rate and Heartbeat Questions [1] Skipped or extra beat(s) AND [2] occurs 4 or more times per minute Joya Gaskins, RN, Scott 06/11/2022 2:42:05 PM Disp. Time Eilene Ghazi Time) Disposition Final User 06/11/2022 2:52:32 PM See HCP within 4 Hours (or PCP triage) Yes Joya Gaskins, RN, Scott PLEASE NOTE: All timestamps contained within this  report are represented as [place] Standard Time. CONFIDENTIALTY NOTICE: This fax transmission is intended only for the addressee. It contains information that is legally privileged, confidential or otherwise protected from use or disclosure. If you are not the intended recipient, you are strictly prohibited from reviewing, disclosing, copying using or disseminating any of this information or taking any action in reliance on or regarding this information. If you have received this fax in error, please notify us immediately by telephone so that we can arrange for its return to [place]. Phone: [phone], Toll-Free: [phone], Fax: [phone] Page: 2 of 2 Call Id: [id number] Final Disposition 06/11/2022 2:52:32 PM See HCP within 4 Hours (or PCP triage) Yes Joya Gaskins, RN, Scott Caller Disagree/Comply Comply Caller Understands Yes PreDisposition [place] Advice Given Per Guideline SEE HCP (OR PCP TRIAGE) WITHIN 4 HOURS: * IF OFFICE WILL BE OPEN: You need to be seen within the next 3 or 4 hours. Call your doctor (or NP/PA) now or as soon as the office opens. BRING MEDICINES: * Please bring a list of your current medicines when you go to see the doctor. * It is also a good idea to bring the pill bottles too. This will help the doctor to make certain you are taking the right medicines and the right dose. CALL BACK IF: * You become worse CARE ADVICE given per Heart Rate and Heartbeat Questions (Adult) guideline. * UCC: Some UCCs can manage patients who are stable and have less serious symptoms (e.g., minor illnesses and injuries). The triager must know the [facility] capabilities before sending a patient there. If unsure, call ahead. Referrals GO TO FACILITY OTHER - SPECIF

## 2022-06-11 NOTE — Telephone Encounter (Signed)
Sounds good. I will check him tomorrow morning

## 2022-06-12 ENCOUNTER — Encounter: Payer: Self-pay | Admitting: Internal Medicine

## 2022-06-12 ENCOUNTER — Ambulatory Visit: Payer: 59 | Admitting: Internal Medicine

## 2022-06-12 VITALS — BP 122/78 | HR 68 | Temp 97.2°F | Ht 70.0 in | Wt 172.0 lb

## 2022-06-12 DIAGNOSIS — I499 Cardiac arrhythmia, unspecified: Secondary | ICD-10-CM | POA: Diagnosis not present

## 2022-06-12 LAB — COMPREHENSIVE METABOLIC PANEL
ALT: 18 U/L (ref 0–53)
AST: 18 U/L (ref 0–37)
Albumin: 4.4 g/dL (ref 3.5–5.2)
Alkaline Phosphatase: 58 U/L (ref 39–117)
BUN: 10 mg/dL (ref 6–23)
CO2: 29 mEq/L (ref 19–32)
Calcium: 9.3 mg/dL (ref 8.4–10.5)
Chloride: 104 mEq/L (ref 96–112)
Creatinine, Ser: 1.04 mg/dL (ref 0.40–1.50)
GFR: 86.73 mL/min (ref >60.00)
Glucose, Bld: 92 mg/dL (ref 70–99)
Potassium: 4.3 mEq/L (ref 3.5–5.1)
Sodium: 138 mEq/L (ref 135–145)
Total Bilirubin: 0.5 mg/dL (ref 0.2–1.2)
Total Protein: 7.2 g/dL (ref 6.0–8.3)

## 2022-06-12 LAB — CBC
HCT: 46.5 % (ref 39.0–52.0)
Hemoglobin: 15.7 g/dL (ref 13.0–17.0)
MCHC: 33.7 g/dL (ref 30.0–36.0)
MCV: 87.7 fl (ref 78.0–100.0)
Platelets: 180 10*3/uL (ref 150.0–400.0)
RBC: 5.3 Mil/uL (ref 4.22–5.81)
RDW: 13.2 % (ref 11.5–15.5)
WBC: 4.3 10*3/uL (ref 4.0–10.5)

## 2022-06-12 LAB — TSH: TSH: 2.12 u[IU]/mL (ref 0.35–5.50)

## 2022-06-12 NOTE — Progress Notes (Signed)
Subjective:    Patient ID: Brian Cox, male    DOB: 05-10-1976, 46 y.o.   MRN: [id number]  HPI Here due to palpitations  About a month ago---he could feel a skip. Then it would clear up Happened again a week ago Then he would feel a skip---several times a minute Is a runner---would not notice during running, but after during cool down Does notice it with activity--steps, housework Sometimes notices at work  No sense of racing heart No caffeine  No chest pain No SOB No change in exercise tolerance--though stopped last week just in case  Current Outpatient Medications on File Prior to Visit  Medication Sig Dispense Refill   Ascorbic Acid (VITAMIN C) 500 MG CAPS Take 500 mg by mouth daily at 12 noon.     Cholecalciferol (VITAMIN D3) 50 MCG (2000 UT) CAPS Take 2,000 Units by mouth every other day.     fluticasone (FLONASE) 50 MCG/ACT nasal spray Place 2 sprays into both nostrils daily at 12 noon.     Zinc 30 MG CAPS Take 30 mg by mouth daily at 12 noon.     No current facility-administered medications on file prior to visit.    Allergies  Allergen Reactions   Ciprofloxacin Hcl Swelling   Donnatal  [Phenobarbital-Belladonna Alk] Swelling   Levofloxacin Swelling    Past Medical History:  Diagnosis Date   Hyperlipidemia    Prostatitis, acute 2019    Past Surgical History:  Procedure Laterality Date   WISDOM TOOTH EXTRACTION      Family History  Problem Relation Age of Onset   Hyperlipidemia Mother    Hypertension Father    Arthritis Father    Diabetes Father    Aneurysm Maternal Grandmother    Stroke Maternal Grandmother    Heart disease Maternal Grandfather    Miscarriages / Stillbirths Sister    Cancer Paternal Grandmother        unknown, blood?   Heart attack Paternal Grandfather 74    Social History   Socioeconomic History   Marital status: Single    Spouse name: Not on file   Number of children: 0   Years of education: Not on file   Highest  education level: Doctorate  Occupational History   Not on file  Tobacco Use   Smoking status: Never    Passive exposure: Past   Smokeless tobacco: Never  Vaping Use   Vaping Use: Never used  Substance and Sexual Activity   Alcohol use: No   Drug use: No   Sexual activity: Yes    Comment: male partner  Other Topics Concern   Not on file  Social History Narrative   09/26/20   From: [place], [place], moved for work   Living: alone   Work: Phd in Print production planner at [employer]      Family: family in [place], good relationship      Enjoys: music - piano, being outside, hiking, running, walking, playing tennis, traveling      Exercise: running [place] 3-5 times a week   Diet: limits meat, has a sweet tooth      Safety   Seat belts: Yes    Guns: No   Safe in relationships: Yes    Social Determinants of Radio broadcast assistant Strain: Not on file  Food Insecurity: Not on file  Transportation Needs: Not on file  Physical Activity: Not on file  Stress: Not on file  Social Connections: Not on  file  Intimate Partner Violence: Not on file   Review of Systems Sleep is not great--- 5-6 hours per night. Awakens rested Appetite is good--weight stable    Objective:   Physical Exam Constitutional:      Appearance: Normal appearance.  Cardiovascular:     Rate and Rhythm: Normal rate and regular rhythm.     Pulses: Normal pulses.     Heart sounds: No murmur heard.    No gallop.  Pulmonary:     Effort: Pulmonary effort is normal.     Breath sounds: Normal breath sounds. No wheezing or rales.  Abdominal:     Palpations: Abdomen is soft.     Tenderness: There is no abdominal tenderness.  Musculoskeletal:     Cervical back: Neck supple.     Right lower leg: No edema.     Left lower leg: No edema.  Lymphadenopathy:     Cervical: No cervical adenopathy.  Neurological:     Mental Status: He is alert.            Assessment & Plan:

## 2022-06-12 NOTE — Assessment & Plan Note (Signed)
Palpitations but only extra beats Haven't occurred with activity when heart rate is up---but sometimes with everyday activity No caffeine Nothing to suggest CAD---but need to assess cardiac function EKG shows sinus at 62. Normal axis and intervals. No ectopy, hypertrophy or ischemia  Discussed options---cardiology vs checking echo/labs----then consider zio patch if ongoing symptoms. He chooses to hold off on cardiology

## 2022-07-17 ENCOUNTER — Other Ambulatory Visit: Payer: 59

## 2022-08-04 ENCOUNTER — Ambulatory Visit
Admission: EM | Admit: 2022-08-04 | Discharge: 2022-08-04 | Disposition: A | Discharge status: Home / Self Care | Payer: 59 | Attending: Emergency Medicine | Admitting: Emergency Medicine

## 2022-08-04 DIAGNOSIS — B349 Viral infection, unspecified: Secondary | ICD-10-CM | POA: Insufficient documentation

## 2022-08-04 DIAGNOSIS — R051 Acute cough: Secondary | ICD-10-CM | POA: Diagnosis present

## 2022-08-04 DIAGNOSIS — Z1152 Encounter for screening for COVID-19: Secondary | ICD-10-CM | POA: Insufficient documentation

## 2022-08-04 DIAGNOSIS — R059 Cough, unspecified: Secondary | ICD-10-CM | POA: Insufficient documentation

## 2022-08-04 LAB — RESP PANEL BY RT-PCR (FLU A&B, COVID) ARPGX2
Influenza A by PCR: NEGATIVE
Influenza B by PCR: NEGATIVE
SARS Coronavirus 2 by RT PCR: NEGATIVE

## 2022-08-04 MED ORDER — BENZONATATE 100 MG PO CAPS: 100.0000 mg | ORAL_CAPSULE | Freq: Three times a day (TID) | ORAL | 0 refills | Status: DC | PRN

## 2022-08-04 MED ORDER — PROMETHAZINE-DM 6.25-15 MG/5ML PO SYRP: 5.0000 mL | ORAL_SOLUTION | Freq: Every evening | ORAL | 0 refills | Status: DC | PRN

## 2022-08-04 NOTE — ED Provider Notes (Signed)
Brian Cox    CSN: [id number] Arrival date & time: 08/04/22  1154      History   Chief Complaint Chief Complaint  Patient presents with   Nasal Congestion   Fever   Cough    HPI Brian Cox is a 46 y.o. male.  Patient presents with 3-day history fever, congestion, runny nose, cough.  Treatment at home with Delsym and ibuprofen.  No rash, sore throat, chest pain, shortness of breath, vomiting, diarrhea, or other symptoms.  The history is provided by the patient and medical records.    Past Medical History:  Diagnosis Date   Hyperlipidemia    Prostatitis, acute 2019    Patient Active Problem List   Diagnosis Date Noted   Cough 08/04/2022   Irregular heart beat 06/12/2022   Low vitamin D level 04/05/2020   Hyperlipidemia 09/25/2017   Lipoma of neck 09/25/2017    Past Surgical History:  Procedure Laterality Date   WISDOM TOOTH EXTRACTION         Home Medications    Prior to Admission medications   Medication Sig Start Date End Date Taking? Authorizing Provider  benzonatate (TESSALON) 100 MG capsule Take 1 capsule (100 mg total) by mouth 3 (three) times daily as needed for cough. 08/04/22  Yes Sharion Balloon, NP  promethazine-dextromethorphan (PROMETHAZINE-DM) 6.25-15 MG/5ML syrup Take 5 mLs by mouth at bedtime as needed. 08/04/22  Yes Sharion Balloon, NP  Ascorbic Acid (VITAMIN C) 500 MG CAPS Take 500 mg by mouth daily at 12 noon.    [provider]  Cholecalciferol (VITAMIN D3) 50 MCG (2000 UT) CAPS Take 2,000 Units by mouth every other day.    [provider]  fluticasone (FLONASE) 50 MCG/ACT nasal spray Place 2 sprays into both nostrils daily at 12 noon.    [provider]  Zinc 30 MG CAPS Take 30 mg by mouth daily at 12 noon.    [provider]    Family History Family History  Problem Relation Age of Onset   Hyperlipidemia Mother    Hypertension Father    Arthritis Father    Diabetes Father     Aneurysm Maternal Grandmother    Stroke Maternal Grandmother    Heart disease Maternal Grandfather    Miscarriages / Stillbirths Sister    Cancer Paternal Grandmother        unknown, blood?   Heart attack Paternal Grandfather 74    Social History Social History   Tobacco Use   Smoking status: Never    Passive exposure: Past   Smokeless tobacco: Never  Vaping Use   Vaping Use: Never used  Substance Use Topics   Alcohol use: No   Drug use: No     Allergies   Ciprofloxacin hcl, Donnatal  [phenobarbital-belladonna alk], and Levofloxacin   Review of Systems Review of Systems  Constitutional:  Positive for fever. Negative for chills.  HENT:  Positive for congestion, postnasal drip and rhinorrhea. Negative for ear pain and sore throat.   Respiratory:  Positive for cough. Negative for shortness of breath.   Cardiovascular:  Negative for chest pain and palpitations.  Gastrointestinal:  Negative for abdominal pain, diarrhea and vomiting.  Skin:  Negative for color change and rash.  All other systems reviewed and are negative.    Physical Exam Triage Vital Signs ED Triage Vitals  Enc Vitals Group     BP 08/04/22 1443 118/80     Pulse Rate 08/04/22 1424 75  Resp 08/04/22 1424 18     Temp 08/04/22 1424 98 F (36.7 C)     Temp src --      SpO2 08/04/22 1424 98 %     Weight 08/04/22 1437 170 lb (77.1 kg)     Height 08/04/22 1437 _0  (1.778 m)     Head Circumference --      Peak Flow --      Pain Score 08/04/22 1435 0     Pain Loc --      Pain Edu? --      Excl. in [place]? --    No data found.  Updated Vital Signs BP 118/80   Pulse 75   Temp 98 F (36.7 C)   Resp 18   Ht _1  (1.778 m)   Wt 170 lb (77.1 kg)   SpO2 98%   BMI 24.39 kg/m   Visual Acuity Right Eye Distance:   Left Eye Distance:   Bilateral Distance:    Right Eye Near:   Left Eye Near:    Bilateral Near:     Physical Exam Vitals and nursing note reviewed.  Constitutional:       General: He is not in acute distress.    Appearance: Normal appearance. He is well-developed. He is not ill-appearing.  HENT:     Right Ear: Tympanic membrane normal.     Left Ear: Tympanic membrane normal.     Nose: Congestion and rhinorrhea present.     Mouth/Throat:     Mouth: Mucous membranes are moist.     Pharynx: Oropharynx is clear.  Cardiovascular:     Rate and Rhythm: Normal rate and regular rhythm.     Heart sounds: Normal heart sounds.  Pulmonary:     Effort: Pulmonary effort is normal. No respiratory distress.     Breath sounds: Normal breath sounds.  Musculoskeletal:     Cervical back: Neck supple.  Skin:    General: Skin is warm and dry.  Neurological:     Mental Status: He is alert.  Psychiatric:        Mood and Affect: Mood normal.        Behavior: Behavior normal.      UC Treatments / Results  Labs (all labs ordered are listed, but only abnormal results are displayed) Labs Reviewed  RESP PANEL BY RT-PCR (FLU A&B, COVID) [id number]    EKG   Radiology No results found.  Procedures Procedures (including critical care time)  Medications Ordered in UC Medications - No data to display  Initial Impression / Assessment and Plan / UC Course  I have reviewed the triage vital signs and the nursing notes.  Pertinent labs & imaging results that were available during my care of the patient were reviewed by me and considered in my medical decision making (see chart for details).    Cough, viral illness.  COVID and flu pending.  Treating cough with Tessalon Perles and promethazine DM.  Precautions for drowsiness with promethazine discussed.  Discussed symptomatic treatment including Tylenol or ibuprofen, rest, hydration.  Instructed patient to follow up with PCP if symptoms are not improving.  He agrees to plan of care.   Final Clinical Impressions(s) / UC Diagnoses   Final diagnoses:  Acute cough  Viral illness     Discharge Instructions      Your  COVID and Flu tests are pending.    Take the Tessalon Perles and promethazine DM as directed.   Do not  drive, operate machinery, drink alcohol, or perform dangerous activities while taking the promethazine DM as it may cause drowsiness.  Take Tylenol or ibuprofen as needed for fever or discomfort.  Rest and keep yourself hydrated.    Follow-up with your primary care provider if your symptoms are not improving.         ED Prescriptions     Medication Sig Dispense Auth. Provider   benzonatate (TESSALON) 100 MG capsule Take 1 capsule (100 mg total) by mouth 3 (three) times daily as needed for cough. 21 capsule Sharion Balloon, NP   promethazine-dextromethorphan (PROMETHAZINE-DM) 6.25-15 MG/5ML syrup Take 5 mLs by mouth at bedtime as needed. 118 mL Sharion Balloon, NP      PDMP not reviewed this encounter.   Sharion Balloon, NP 08/04/22 1500

## 2022-08-04 NOTE — ED Triage Notes (Addendum)
Patient to Urgent Care with complaints of nasal congestion, fevers, and constant dry cough. Possible low grade fevers. Reports constantly needing to blow his nose.  Symptoms started on Wednesday. Reports he recently flew to and from [place].   Has been taking delsym/ ibuprofen.

## 2022-08-04 NOTE — Discharge Instructions (Addendum)
Your COVID and Flu tests are pending.    Take the Tessalon Perles and promethazine DM as directed.   Do not drive, operate machinery, drink alcohol, or perform dangerous activities while taking the promethazine DM as it may cause drowsiness.  Take Tylenol or ibuprofen as needed for fever or discomfort.  Rest and keep yourself hydrated.    Follow-up with your primary care provider if your symptoms are not improving.

## 2022-08-07 ENCOUNTER — Ambulatory Visit: Payer: 59 | Attending: Internal Medicine

## 2022-08-07 DIAGNOSIS — I499 Cardiac arrhythmia, unspecified: Secondary | ICD-10-CM

## 2022-08-08 LAB — ECHOCARDIOGRAM COMPLETE
AR max vel: 3.18 cm2
AV Area VTI: 3.05 cm2
AV Area mean vel: 3.19 cm2
AV Mean grad: 3 mmHg
AV Peak grad: 6 mmHg
Ao pk vel: 1.22 m/s
Area-P 1/2: 2.71 cm2
S' Lateral: 2.5 cm

## 2023-01-23 ENCOUNTER — Encounter: Payer: Self-pay | Admitting: Family Medicine

## 2023-01-23 ENCOUNTER — Ambulatory Visit: Payer: 59 | Admitting: Family Medicine

## 2023-01-23 VITALS — BP 114/64 | HR 70 | Temp 97.7°F | Ht 70.0 in | Wt 172.0 lb

## 2023-01-23 DIAGNOSIS — G248 Other dystonia: Secondary | ICD-10-CM | POA: Diagnosis not present

## 2023-01-23 LAB — MAGNESIUM: Magnesium: 1.9 mg/dL (ref 1.5–2.5)

## 2023-01-23 LAB — COMPREHENSIVE METABOLIC PANEL
ALT: 19 U/L (ref 0–53)
AST: 21 U/L (ref 0–37)
Albumin: 4.4 g/dL (ref 3.5–5.2)
Alkaline Phosphatase: 61 U/L (ref 39–117)
BUN: 13 mg/dL (ref 6–23)
CO2: 22 mEq/L (ref 19–32)
Calcium: 9.2 mg/dL (ref 8.4–10.5)
Chloride: 105 mEq/L (ref 96–112)
Creatinine, Ser: 0.97 mg/dL (ref 0.40–1.50)
GFR: 93.88 mL/min (ref >60.00)
Glucose, Bld: 90 mg/dL (ref 70–99)
Potassium: 4.2 mEq/L (ref 3.5–5.1)
Sodium: 138 mEq/L (ref 135–145)
Total Bilirubin: 0.5 mg/dL (ref 0.2–1.2)
Total Protein: 7.7 g/dL (ref 6.0–8.3)

## 2023-01-23 LAB — TSH: TSH: 2.18 u[IU]/mL (ref 0.35–5.50)

## 2023-01-23 NOTE — Progress Notes (Signed)
Patient ID: Brian Cox, male    DOB: [birth year], 47 y.o.   MRN: [id number]  This visit was conducted in person.  BP 114/64 (BP Location: Left Arm, Patient Position: Sitting, Cuff Size: Normal)   Pulse 70   Temp 97.7 F (36.5 C) (Temporal)   Ht 5\' 10"  (1.778 m)   Wt 172 lb (78 kg)   SpO2 97%   BMI 24.68 kg/m    CC:  Chief Complaint  Patient presents with   dystonia    Of the middle finger on right hand since July 2023    Subjective:   HPI: Brian Cox is a 47 y.o. male  previous patient of Dr. Selena Batten, with no current PCP set up presenting on 01/23/2023 for dystonia (Of the middle finger on right hand since July 2023)   He reports ongoing involuntary muscle contraction in right middle finger since 02/2022. Slight issue in ring finger. He feels it has gradually  He has not seen a MD about this.  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  No proceeding injury.   Notes finger curl under.   Full ROM. No pain, no redness. No swelling of joints.  No other joint issues.   Self referred to PT.. tried heat, ice and cross friction massage.... di not help much.  Has recently tried muscle retraining and mindfulness.     Has new pt appt set up in the summer.       Relevant past medical, surgical, family and social history reviewed and updated as indicated. Interim medical history since our last visit reviewed. Allergies and medications reviewed and updated. Outpatient Medications Prior to Visit  Medication Sig Dispense Refill   Ascorbic Acid (VITAMIN C) 500 MG CAPS Take 500 mg by mouth daily at 12 noon.     Cholecalciferol (VITAMIN D3) 50 MCG (2000 UT) CAPS Take 2,000 Units by mouth every other day.     fluticasone (FLONASE) 50 MCG/ACT nasal spray Place 2 sprays into both nostrils daily at 12 noon.     Zinc 30 MG CAPS Take 30 mg by mouth daily at 12 noon.     benzonatate (TESSALON) 100 MG capsule Take 1 capsule (100 mg total) by mouth 3  (three) times daily as needed for cough. 21 capsule 0   promethazine-dextromethorphan (PROMETHAZINE-DM) 6.25-15 MG/5ML syrup Take 5 mLs by mouth at bedtime as needed. 118 mL 0   No facility-administered medications prior to visit.     Per HPI unless specifically indicated in ROS section below Review of Systems  Constitutional:  Negative for fatigue and fever.  HENT:  Negative for ear pain.   Eyes:  Negative for pain.  Respiratory:  Negative for cough and shortness of breath.   Cardiovascular:  Negative for chest pain, palpitations and leg swelling.  Gastrointestinal:  Negative for abdominal pain.  Genitourinary:  Negative for dysuria.  Musculoskeletal:  Negative for arthralgias.  Neurological:  Negative for syncope, light-headedness and headaches.  Psychiatric/Behavioral:  Negative for dysphoric mood.    Objective:  BP 114/64 (BP Location: Left Arm, Patient Position: Sitting, Cuff Size: Normal)   Pulse 70   Temp 97.7 F (36.5 C) (Temporal)   Ht 5\' 10"  (1.778 m)   Wt 172 lb (78 kg)   SpO2 97%   BMI 24.68 kg/m   Wt Readings from Last 3 Encounters:  01/23/23 172 lb (78 kg)  08/04/22 170 lb (77.1 kg)  06/12/22 172 lb (78 kg)  Physical Exam Constitutional:      Appearance: He is well-developed.  HENT:     Head: Normocephalic.     Right Ear: Hearing normal.     Left Ear: Hearing normal.     Nose: Nose normal.  Neck:     Thyroid: No thyroid mass or thyromegaly.     Vascular: No carotid bruit.     Trachea: Trachea normal.  Cardiovascular:     Rate and Rhythm: Normal rate and regular rhythm.     Pulses: Normal pulses.     Heart sounds: Heart sounds not distant. No murmur heard.    No friction rub. No gallop.     Comments: No peripheral edema Pulmonary:     Effort: Pulmonary effort is normal. No respiratory distress.     Breath sounds: Normal breath sounds.  Musculoskeletal:     Right shoulder: Normal.     Left shoulder: Normal.     Right upper arm: Normal.      Left upper arm: Normal.     Right elbow: Normal.     Left elbow: Normal.     Right forearm: Normal.     Left forearm: Normal.     Right wrist: No swelling, deformity, tenderness or bony tenderness. Normal range of motion. Normal pulse.     Left wrist: No swelling, deformity, tenderness or bony tenderness. Normal range of motion. Normal pulse.     Right hand: No swelling, deformity, tenderness or bony tenderness. Normal range of motion. Normal strength. Normal sensation. Normal capillary refill. Normal pulse.     Left hand: No deformity, tenderness or bony tenderness. Normal range of motion. Normal strength. Normal capillary refill. Normal pulse.     Comments: No specific nail changes Negative Spurling's bilaterally Negative Tinel and Phalen No evidence of trigger finger on right middle or ring.  No nodule palpated.  Skin:    General: Skin is warm and dry.     Findings: No rash.  Neurological:     General: No focal deficit present.     Mental Status: He is alert and oriented to person, place, and time.     Cranial Nerves: Cranial nerves 2-12 are intact.     Sensory: Sensation is intact.     Coordination: Coordination is intact.  Psychiatric:        Speech: Speech normal.        Behavior: Behavior normal.        Thought Content: Thought content normal.       Results for orders placed or performed in visit on 08/07/22  ECHOCARDIOGRAM COMPLETE  Result Value Ref Range   AR max vel 3.18 cm2   AV Peak grad 6.0 mmHg   Ao pk vel 1.22 m/s   S' Lateral 2.50 cm   Area-P 1/2 2.71 cm2   AV Area VTI 3.05 cm2   AV Mean grad 3.0 mmHg   AV Area mean vel 3.19 cm2    Assessment and Plan  Focal dystonia Assessment & Plan: Chronic, no clear multisystem issue or evidence of neurologic disease.  Normal neurologic exam.  No evidence of atrophy. Will evaluate for electrolyte abnormality, glucose issue, thyroid disease and copper level. 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 He has not responded to physical therapy, mindfulness and muscle retraining. Discussed consideration of acupuncture. Will make referral to [facility] per patient request.  Orders: -  Ambulatory referral to Neurology -     Comprehensive metabolic panel -     TSH -     Copper, serum -     Magnesium    No follow-ups on file.   Kerby Nora, MD

## 2023-01-23 NOTE — Assessment & Plan Note (Signed)
Chronic, no clear multisystem issue or evidence of neurologic disease.  Normal neurologic exam.  No evidence of atrophy. Will evaluate for electrolyte abnormality, glucose issue, thyroid disease and copper level. 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 He has not responded to physical therapy, mindfulness and muscle retraining. Discussed consideration of acupuncture. Will make referral to [facility] per patient request.

## 2023-01-27 LAB — COPPER, SERUM: Copper: 71 ug/dL (ref 70–175)

## 2023-04-04 ENCOUNTER — Encounter (INDEPENDENT_AMBULATORY_CARE_PROVIDER_SITE_OTHER): Payer: Self-pay

## 2023-05-10 ENCOUNTER — Ambulatory Visit (INDEPENDENT_AMBULATORY_CARE_PROVIDER_SITE_OTHER): Payer: 59 | Admitting: Family Medicine

## 2023-05-10 ENCOUNTER — Encounter: Payer: Self-pay | Admitting: Family Medicine

## 2023-05-10 VITALS — BP 102/70 | HR 65 | Temp 97.6°F | Ht 70.5 in | Wt 170.4 lb

## 2023-05-10 DIAGNOSIS — Z23 Encounter for immunization: Secondary | ICD-10-CM

## 2023-05-10 DIAGNOSIS — R7989 Other specified abnormal findings of blood chemistry: Secondary | ICD-10-CM | POA: Diagnosis not present

## 2023-05-10 DIAGNOSIS — Z0000 Encounter for general adult medical examination without abnormal findings: Secondary | ICD-10-CM

## 2023-05-10 DIAGNOSIS — Z1211 Encounter for screening for malignant neoplasm of colon: Secondary | ICD-10-CM

## 2023-05-10 DIAGNOSIS — E785 Hyperlipidemia, unspecified: Secondary | ICD-10-CM | POA: Diagnosis not present

## 2023-05-10 LAB — COMPREHENSIVE METABOLIC PANEL
ALT: 23 U/L (ref 0–53)
AST: 20 U/L (ref 0–37)
Albumin: 4.3 g/dL (ref 3.5–5.2)
Alkaline Phosphatase: 70 U/L (ref 39–117)
BUN: 11 mg/dL (ref 6–23)
CO2: 28 mEq/L (ref 19–32)
Calcium: 9.2 mg/dL (ref 8.4–10.5)
Chloride: 104 mEq/L (ref 96–112)
Creatinine, Ser: 1.1 mg/dL (ref 0.40–1.50)
GFR: 80.56 mL/min (ref >60.00)
Glucose, Bld: 96 mg/dL (ref 70–99)
Potassium: 4.6 mEq/L (ref 3.5–5.1)
Sodium: 137 mEq/L (ref 135–145)
Total Bilirubin: 0.4 mg/dL (ref 0.2–1.2)
Total Protein: 7 g/dL (ref 6.0–8.3)

## 2023-05-10 LAB — LIPID PANEL
Cholesterol: 158 mg/dL (ref 0–200)
HDL: 40.7 mg/dL (ref >39.00)
LDL Cholesterol: 103 mg/dL — ABNORMAL HIGH (ref 0–99)
NonHDL: 117.24
Total CHOL/HDL Ratio: 4
Triglycerides: 70 mg/dL (ref 0.0–149.0)
VLDL: 14 mg/dL (ref 0.0–40.0)

## 2023-05-10 LAB — VITAMIN D 25 HYDROXY (VIT D DEFICIENCY, FRACTURES): VITD: 34.23 ng/mL (ref 30.00–100.00)

## 2023-05-10 NOTE — Assessment & Plan Note (Signed)
On supplement 2000 international units daily.

## 2023-05-10 NOTE — Patient Instructions (Signed)
Please stop at the lab to have labs drawn.

## 2023-05-10 NOTE — Progress Notes (Signed)
Patient ID: Brian Cox, male    DOB: 04/23/1976, 47 y.o.   MRN: [id number]  This visit was conducted in person.  BP 102/70 (BP Location: Right Arm, Patient Position: Sitting, Cuff Size: Normal)   Pulse 65   Temp 97.6 F (36.4 C) (Temporal)   Ht 5' 10.5" (1.791 m)   Wt 170 lb 6 oz (77.3 kg)   SpO2 98%   BMI 24.10 kg/m    CC:  Chief Complaint  Patient presents with   Establish Care    TOC from Dr. Selena Batten    Subjective:   HPI: Brian Cox is a 47 y.o. male presenting on 05/10/2023 for Establish Care (TOC from Dr. Selena Batten)  Last PCP: Dr. 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  Referred to [facility].   Has appt there next week.healthy diet, lots of salads.   Exercise: running 3-5 days a week  Diet: heart  healthy diet  Relevant past medical, surgical, family and social history reviewed and updated as indicated. Interim medical history since our last visit reviewed. Allergies and medications reviewed and updated. Outpatient Medications Prior to Visit  Medication Sig Dispense Refill   Ascorbic Acid (VITAMIN C) 500 MG CAPS Take 500 mg by mouth daily at 12 noon.     Cholecalciferol (VITAMIN D3) 50 MCG (2000 UT) CAPS Take 2,000 Units by mouth every other day.     fluticasone (FLONASE) 50 MCG/ACT nasal spray Place 2 sprays into both nostrils daily at 12 noon.     Zinc 30 MG CAPS Take 30 mg by mouth daily at 12 noon.     No facility-administered medications prior to visit.     Per HPI unless specifically indicated in ROS section below Review of Systems  Constitutional:  Negative for fatigue and fever.  HENT:  Negative for ear pain.   Eyes:   Negative for pain.  Respiratory:  Negative for cough and shortness of breath.   Cardiovascular:  Negative for chest pain, palpitations and leg swelling.  Gastrointestinal:  Negative for abdominal pain.  Genitourinary:  Negative for dysuria.  Musculoskeletal:  Negative for arthralgias.  Neurological:  Negative for syncope, light-headedness and headaches.  Psychiatric/Behavioral:  Negative for dysphoric mood.    Objective:  BP 102/70 (BP Location: Right Arm, Patient Position: Sitting, Cuff Size: Normal)   Pulse 65   Temp 97.6 F (36.4 C) (Temporal)   Ht 5' 10.5" (1.791 m)   Wt 170 lb 6 oz (77.3 kg)   SpO2 98%   BMI 24.10 kg/m   Wt Readings from Last 3 Encounters:  05/10/23 170 lb 6 oz (77.3 kg)  01/23/23 172 lb (78 kg)  08/04/22 170 lb (77.1 kg)      Physical Exam Constitutional:      Appearance: He is well-developed.  HENT:     Head: Normocephalic.     Right Ear: Hearing normal.     Left Ear: Hearing normal.     Nose: Nose normal.  Neck:     Thyroid: No thyroid mass or thyromegaly.     Vascular: No carotid bruit.  Trachea: Trachea normal.  Cardiovascular:     Rate and Rhythm: Normal rate and regular rhythm.     Pulses: Normal pulses.     Heart sounds: Heart sounds not distant. No murmur heard.    No friction rub. No gallop.     Comments: No peripheral edema Pulmonary:     Effort: Pulmonary effort is normal. No respiratory distress.     Breath sounds: Normal breath sounds.  Skin:    General: Skin is warm and dry.     Findings: No rash.  Psychiatric:        Speech: Speech normal.        Behavior: Behavior normal.        Thought Content: Thought content normal.       Results for orders placed or performed in visit on 01/23/23  Comprehensive metabolic panel  Result Value Ref Range   Sodium 138 135 - 145 mEq/L   Potassium 4.2 3.5 - 5.1 mEq/L   Chloride 105 96 - 112 mEq/L   CO2 22 19 - 32 mEq/L   Glucose, Bld 90 70 - 99 mg/dL   BUN 13 6 - 23 mg/dL    Creatinine, Ser 1.61 0.40 - 1.50 mg/dL   Total Bilirubin 0.5 0.2 - 1.2 mg/dL   Alkaline Phosphatase 61 39 - 117 U/L   AST 21 0 - 37 U/L   ALT 19 0 - 53 U/L   Total Protein 7.7 6.0 - 8.3 g/dL   Albumin 4.4 3.5 - 5.2 g/dL   GFR 09.60 >45.40 mL/min   Calcium 9.2 8.4 - 10.5 mg/dL  TSH  Result Value Ref Range   TSH 2.18 0.35 - 5.50 uIU/mL  Copper, Serum  Result Value Ref Range   Copper 71 70 - 175 mcg/dL  Magnesium  Result Value Ref Range   Magnesium 1.9 1.5 - 2.5 mg/dL    Assessment and Plan The patient's preventative maintenance and recommended screening tests for an annual wellness exam were reviewed in full today. Brought up to date unless services declined.  Counselled on the importance of diet, exercise, and its role in overall health and mortality. The patient's FH and SH was reviewed, including their home life, tobacco status, and drug and alcohol status.    Vaccines: Due for flu shot Prostate Cancer Screen: no family history of prostate cancer Colon Cancer Screen:   iFOB yearly. Negative 2023      Smoking Status:none ETOH/ drug use: none/none  Hep C: done  HIV screen:   done in past, no new partners  Routine general medical examination at a health care facility  Hyperlipidemia, unspecified hyperlipidemia type Assessment & Plan: Due for reevaluation  Orders: -     Lipid panel; Future -     Comprehensive metabolic panel; Future  Low vitamin D level Assessment & Plan:  On supplement 2000 international units daily.  Orders: -     VITAMIN D 25 Hydroxy (Vit-D Deficiency, Fractures); Future  Colon cancer screening -     Fecal occult blood, imunochemical; Future    Return in about 1 year (around 05/09/2024) for annual physical with fasting labs prior.   Kerby Nora, MD

## 2023-05-10 NOTE — Assessment & Plan Note (Signed)
Due for reevaluation

## 2023-05-16 ENCOUNTER — Other Ambulatory Visit: Payer: Self-pay

## 2023-05-16 DIAGNOSIS — Z1211 Encounter for screening for malignant neoplasm of colon: Secondary | ICD-10-CM

## 2023-05-17 LAB — FECAL OCCULT BLOOD, IMMUNOCHEMICAL: Fecal Occult Bld: NEGATIVE

## 2024-01-12 ENCOUNTER — Other Ambulatory Visit: Payer: Self-pay

## 2024-01-12 ENCOUNTER — Ambulatory Visit
Admission: EM | Admit: 2024-01-12 | Discharge: 2024-01-12 | Disposition: A | Discharge status: Home / Self Care | Attending: Emergency Medicine | Admitting: Emergency Medicine

## 2024-01-12 ENCOUNTER — Encounter: Payer: Self-pay | Admitting: *Deleted

## 2024-01-12 DIAGNOSIS — R21 Rash and other nonspecific skin eruption: Secondary | ICD-10-CM | POA: Diagnosis not present

## 2024-01-12 MED ORDER — HYDROXYZINE HCL 25 MG PO TABS: 25.0000 mg | ORAL_TABLET | Freq: Four times a day (QID) | ORAL | 0 refills | Status: DC

## 2024-01-12 MED ORDER — PREDNISONE 10 MG (21) PO TBPK: ORAL_TABLET | Freq: Every day | ORAL | 0 refills | Status: DC

## 2024-01-12 NOTE — ED Triage Notes (Signed)
 PT reports hives yesterday in the afternoon. Pt unsure of what may have caused hives. Pt has redden areas on torso ,arms. PT took benedryl and ibuprofen yesterday.

## 2024-01-12 NOTE — Discharge Instructions (Addendum)
 You are being treated for rash which appears inflammatory, at this time no signs of infection, unknown cause of your symptoms however this is only concerning if your rash is reoccurring, we do need to determine cause and you may follow-up with the allergist if you would like allergy testing  take prednisone every morning with food as directed, to help reduce the inflammatory process that occurs with this rash which will help minimize your itching as well as begin to clear  May use hydroxyzine every 6 hours as needed to help with itching  You may  use of topical calamine or Benadryl  cream to help manage itching, you may also continue oral Benadryl   Please avoid long exposures to heat such as a hot steamy shower or being outside as this may cause further irritation to your rash  You may follow-up with his urgent care as needed if symptoms persist or worsen

## 2024-01-12 NOTE — ED Provider Notes (Signed)
 Brian Cox    CSN: [id number] Arrival date & time: 01/12/24  1400      History   Chief Complaint Chief Complaint  Patient presents with   Allergic Reaction    HPI Brian Cox is a 48 y.o. male.   Patient presents for evaluation of possible hives present for 1 day, unknown cause.  Started on the abdomen and spread to the bilateral upper extremities, the neck and the right foot.  Denies changes in diet, toiletries, recent travel, wooded plantar areas.  Denies fever or drainage.  Attempted use of Benadryl  and ibuprofen.  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 Authorizing Provider  Ascorbic Acid (VITAMIN C) 500 MG CAPS Take 500 mg by mouth daily at 12 noon.   Yes [provider]  Cholecalciferol (VITAMIN D3) 50 MCG (2000 UT) CAPS Take 2,000 Units by mouth every other day.   Yes [provider]  fluticasone (FLONASE) 50 MCG/ACT nasal spray Place 2 sprays into both nostrils daily at 12 noon.   Yes [provider]  hydrOXYzine  (ATARAX ) 25 MG tablet Take 1 tablet (25 mg total) by mouth every 6 (six) hours. 01/12/24  Yes Brittin Janik R, NP  predniSONE  (STERAPRED UNI-PAK 21 TAB) 10 MG (21) TBPK tablet Take by mouth daily. Take 6 tabs by mouth daily  for 1 days, then 5 tabs for 1 days, then 4 tabs for 1 days, then 3 tabs for 1 days, 2 tabs for 1 days, then 1 tab by mouth daily for 1 days 01/12/24  Yes Ledarius Leeson, Nerissa Bannister R, NP  Zinc 30 MG CAPS Take 30 mg by mouth daily at 12 noon.   Yes [provider]    Family History Family History   Problem Relation Age of Onset   Hyperlipidemia Mother    Hypertension Father    Arthritis Father    Diabetes Father    Aneurysm Maternal Grandmother    Stroke Maternal Grandmother    Heart disease Maternal Grandfather    Miscarriages / Stillbirths Sister    Cancer Paternal Grandmother        unknown, blood?   Heart attack Paternal Grandfather 29    Social History Social History   Tobacco Use   Smoking status: Never    Passive exposure: Past   Smokeless tobacco: Never  Vaping Use   Vaping status: Never Used  Substance Use Topics   Alcohol use: No   Drug use: No     Allergies   Ciprofloxacin hcl, Donnatal  [phenobarbital-belladonna alk], and Levofloxacin   Review of Systems Review of Systems   Physical Exam Triage Vital Signs ED Triage Vitals  Encounter Vitals Group     BP 01/12/24 1407 119/78     Systolic BP Percentile --      Diastolic BP Percentile --      Pulse Rate 01/12/24 1407 74     Resp 01/12/24 1407 20     Temp 01/12/24 1407 98.1 F (36.7 C)     Temp  src --      SpO2 01/12/24 1407 97 %     Weight --      Height --      Head Circumference --      Peak Flow --      Pain Score 01/12/24 1405 0     Pain Loc --      Pain Education --      Exclude from Growth Chart --    No data found.  Updated Vital Signs BP 119/78   Pulse 74   Temp 98.1 F (36.7 C)   Resp 20   SpO2 97%   Visual Acuity Right Eye Distance:   Left Eye Distance:   Bilateral Distance:    Right Eye Near:   Left Eye Near:    Bilateral Near:     Physical Exam Constitutional:      Appearance: Normal appearance.  HENT:     Head: Normocephalic.  Eyes:     Extraocular Movements: Extraocular movements intact.  Pulmonary:     Effort: Pulmonary effort is normal.  Skin:    Comments: Erythematous papular rash scattered to the trunk of the body, the bilateral upper extremities, the anterior and posterior neck and the right foot  Neurological:     Mental Status: He is alert  and oriented to person, place, and time. Mental status is at baseline.      UC Treatments / Results  Labs (all labs ordered are listed, but only abnormal results are displayed) Labs Reviewed - No data to display  EKG   Radiology No results found.  Procedures Procedures (including critical care time)  Medications Ordered in UC Medications - No data to display  Initial Impression / Assessment and Plan / UC Course  I have reviewed the triage vital signs and the nursing notes.  Pertinent labs & imaging results that were available during my care of the patient were reviewed by me and considered in my medical decision making (see chart for details).  Rash and nonspecific skin eruption to the  Appears inflammatory, no signs of infection, unknown etiology, prescribed prednisone and hydroxyzine, declined IM injection, recommended supportive care through oral and topical antihistamines and advised along exposure to heat to prevent further irritation, recommended follow-up if symptoms continue to persist, if symptoms are reoccurring may follow-up with allergist Final Clinical Impressions(s) / UC Diagnoses   Final diagnoses:  Rash and nonspecific skin eruption   Discharge Instructions      You are being treated for rash which appears inflammatory, at this time no signs of infection, unknown cause of your symptoms however this is only concerning if your rash is reoccurring, we do need to determine cause and you may follow-up with the allergist if you would like allergy testing  take prednisone every morning with food as directed, to help reduce the inflammatory process that occurs with this rash which will help minimize your itching as well as begin to clear  May use hydroxyzine every 6 hours as needed to help with itching  You may  use of topical calamine or Benadryl  cream to help manage itching, you may also continue oral Benadryl   Please avoid long exposures to heat such as a hot  steamy shower or being outside as this may cause further irritation to your rash  You may follow-up with his urgent care as needed if symptoms persist or worsen   ED Prescriptions     Medication Sig Dispense Auth. Provider   predniSONE [facility]  UNI-PAK 21 TAB) 10 MG (21) TBPK tablet Take by mouth daily. Take 6 tabs by mouth daily  for 1 days, then 5 tabs for 1 days, then 4 tabs for 1 days, then 3 tabs for 1 days, 2 tabs for 1 days, then 1 tab by mouth daily for 1 days 21 tablet Aribelle Mccosh R, NP   hydrOXYzine (ATARAX) 25 MG tablet Take 1 tablet (25 mg total) by mouth every 6 (six) hours. 12 tablet Janella Rogala R, NP      PDMP not reviewed this encounter.   Reena Canning, NP 01/12/24 1526

## 2024-01-23 ENCOUNTER — Ambulatory Visit: Admitting: Allergy

## 2024-02-18 ENCOUNTER — Ambulatory Visit: Admitting: Allergy & Immunology

## 2024-04-23 ENCOUNTER — Telehealth: Payer: Self-pay | Admitting: *Deleted

## 2024-04-23 DIAGNOSIS — E785 Hyperlipidemia, unspecified: Secondary | ICD-10-CM

## 2024-04-23 DIAGNOSIS — R7989 Other specified abnormal findings of blood chemistry: Secondary | ICD-10-CM

## 2024-04-23 NOTE — Telephone Encounter (Signed)
-----   Message from Veva JINNY Ferrari sent at 04/23/2024 10:50 AM EDT ----- Regarding: Lab orders for Tue, 9.16.25 Patient is scheduled for CPX labs, please order future labs, Thanks , Veva

## 2024-05-05 ENCOUNTER — Ambulatory Visit: Payer: Self-pay | Admitting: Family Medicine

## 2024-05-05 ENCOUNTER — Other Ambulatory Visit (INDEPENDENT_AMBULATORY_CARE_PROVIDER_SITE_OTHER): Payer: 59

## 2024-05-05 DIAGNOSIS — E785 Hyperlipidemia, unspecified: Secondary | ICD-10-CM | POA: Diagnosis not present

## 2024-05-05 DIAGNOSIS — R7989 Other specified abnormal findings of blood chemistry: Secondary | ICD-10-CM | POA: Diagnosis not present

## 2024-05-05 LAB — COMPREHENSIVE METABOLIC PANEL WITH GFR
ALT: 27 U/L (ref 0–53)
AST: 23 U/L (ref 0–37)
Albumin: 4.2 g/dL (ref 3.5–5.2)
Alkaline Phosphatase: 57 U/L (ref 39–117)
BUN: 11 mg/dL (ref 6–23)
CO2: 26 meq/L (ref 19–32)
Calcium: 8.9 mg/dL (ref 8.4–10.5)
Chloride: 104 meq/L (ref 96–112)
Creatinine, Ser: 1.06 mg/dL (ref 0.40–1.50)
GFR: 83.64 mL/min (ref >60.00)
Glucose, Bld: 92 mg/dL (ref 70–99)
Potassium: 3.8 meq/L (ref 3.5–5.1)
Sodium: 138 meq/L (ref 135–145)
Total Bilirubin: 0.4 mg/dL (ref 0.2–1.2)
Total Protein: 7 g/dL (ref 6.0–8.3)

## 2024-05-05 LAB — LIPID PANEL
Cholesterol: 163 mg/dL (ref 0–200)
HDL: 34.2 mg/dL — ABNORMAL LOW (ref >39.00)
LDL Cholesterol: 115 mg/dL — ABNORMAL HIGH (ref 0–99)
NonHDL: 129.25
Total CHOL/HDL Ratio: 5
Triglycerides: 73 mg/dL (ref 0.0–149.0)
VLDL: 14.6 mg/dL (ref 0.0–40.0)

## 2024-05-05 LAB — VITAMIN D 25 HYDROXY (VIT D DEFICIENCY, FRACTURES): VITD: 33.86 ng/mL (ref 30.00–100.00)

## 2024-05-05 NOTE — Progress Notes (Signed)
 No critical labs need to be addressed urgently. We will discuss labs in detail at upcoming office visit.

## 2024-05-12 ENCOUNTER — Encounter: Payer: Self-pay | Admitting: Family Medicine

## 2024-05-12 ENCOUNTER — Ambulatory Visit: Payer: 59 | Admitting: Family Medicine

## 2024-05-12 VITALS — BP 108/63 | HR 64 | Temp 97.6°F | Ht 70.25 in | Wt 180.4 lb

## 2024-05-12 DIAGNOSIS — Z0000 Encounter for general adult medical examination without abnormal findings: Secondary | ICD-10-CM | POA: Diagnosis not present

## 2024-05-12 DIAGNOSIS — E785 Hyperlipidemia, unspecified: Secondary | ICD-10-CM

## 2024-05-12 DIAGNOSIS — Z1211 Encounter for screening for malignant neoplasm of colon: Secondary | ICD-10-CM | POA: Diagnosis not present

## 2024-05-12 DIAGNOSIS — Z23 Encounter for immunization: Secondary | ICD-10-CM

## 2024-05-12 NOTE — Progress Notes (Signed)
 Patient ID: Brian Cox, male    DOB: 09/16/1975, 49 y.o.   MRN: [id number]  This visit was conducted in person.  BP 108/63   Pulse 64   Temp 97.6 F (36.4 C) (Temporal)   Ht 5' 10.25 (1.784 m)   Wt 180 lb 6 oz (81.8 kg)   SpO2 97%   BMI 25.70 kg/m    CC:  Chief Complaint  Patient presents with   Annual Exam    Subjective:   HPI: Brian Cox is a 48 y.o. male presenting on 05/12/2024 for Annual Exam   Hyperlipidemia: Lab Results  Component Value Date   CHOL 163 05/05/2024   HDL 34.20 (L) 05/05/2024   LDLCALC 115 (H) 05/05/2024   TRIG 73.0 05/05/2024   CHOLHDL 5 05/05/2024  The 10-year ASCVD risk score (Arnett DK, et al., 2019) is: 2.1%   Values used to calculate the score:     Age: 48 years     Clincally relevant sex: Male     Is Non-Hispanic African American: No     Diabetic: No     Tobacco smoker: No     Systolic Blood Pressure: 108 mmHg     Is BP treated: No     HDL Cholesterol: 34.2 mg/dL     Total Cholesterol: 163 mg/dL   Low vitamin D : nml on supplement  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  [facility].   SABRA  He has been in retraining in last year.   Exercise: running 5-7days a week, 3 miles  Diet: heart  healthy diet  Relevant past medical, surgical, family and social history reviewed and updated as indicated. Interim medical history since our last visit reviewed. Allergies and medications reviewed and updated. Outpatient Medications Prior to Visit  Medication Sig Dispense Refill   Ascorbic Acid (VITAMIN C) 500 MG CAPS Take 500 mg by mouth daily at 12 noon.     Cholecalciferol (VITAMIN D3) 50 MCG (2000 UT) CAPS Take 2,000 Units by mouth every other day.     fluticasone (FLONASE) 50 MCG/ACT nasal spray Place 2 sprays into both nostrils daily at 12 noon.     Zinc 30 MG CAPS Take 30 mg by mouth daily at 12 noon.     hydrOXYzine  (ATARAX ) 25 MG tablet Take 1 tablet (25 mg  total) by mouth every 6 (six) hours. 12 tablet 0   predniSONE  (STERAPRED UNI-PAK 21 TAB) 10 MG (21) TBPK tablet Take by mouth daily. Take 6 tabs by mouth daily  for 1 days, then 5 tabs for 1 days, then 4 tabs for 1 days, then 3 tabs for 1 days, 2 tabs for 1 days, then 1 tab by mouth daily for 1 days 21 tablet 0   No facility-administered medications prior to visit.     Per HPI unless specifically indicated in ROS section below Review of Systems  Constitutional:  Negative for fatigue and fever.  HENT:  Negative for ear pain.   Eyes:  Negative for pain.  Respiratory:  Negative for cough and shortness of breath.   Cardiovascular:  Negative for chest pain, palpitations and leg swelling.  Gastrointestinal:  Negative for abdominal pain.  Genitourinary:  Negative for dysuria.  Musculoskeletal:  Negative for arthralgias.  Neurological:  Negative for syncope, light-headedness and headaches.  Psychiatric/Behavioral:  Negative for dysphoric mood.    Objective:  BP 108/63   Pulse 64  Temp 97.6 F (36.4 C) (Temporal)   Ht 5' 10.25 (1.784 m)   Wt 180 lb 6 oz (81.8 kg)   SpO2 97%   BMI 25.70 kg/m   Wt Readings from Last 3 Encounters:  05/12/24 180 lb 6 oz (81.8 kg)  05/10/23 170 lb 6 oz (77.3 kg)  01/23/23 172 lb (78 kg)      Physical Exam Constitutional:      Appearance: He is well-developed.  HENT:     Head: Normocephalic.     Right Ear: Hearing normal.     Left Ear: Hearing normal.     Nose: Nose normal.  Neck:     Thyroid : No thyroid  mass or thyromegaly.     Vascular: No carotid bruit.     Trachea: Trachea normal.  Cardiovascular:     Rate and Rhythm: Normal rate and regular rhythm.     Pulses: Normal pulses.     Heart sounds: Heart sounds not distant. No murmur heard.    No friction rub. No gallop.     Comments: No peripheral edema Pulmonary:     Effort: Pulmonary effort is normal. No respiratory distress.     Breath sounds: Normal breath sounds.  Skin:    General:  Skin is warm and dry.     Findings: No rash.  Psychiatric:        Speech: Speech normal.        Behavior: Behavior normal.        Thought Content: Thought content normal.       Results for orders placed or performed in visit on 05/05/24  VITAMIN D  25 Hydroxy (Vit-D Deficiency, Fractures)   Collection Time: 05/05/24  7:43 AM  Result Value Ref Range   VITD 33.86 30.00 - 100.00 ng/mL  Comprehensive metabolic panel   Collection Time: 05/05/24  7:43 AM  Result Value Ref Range   Sodium 138 135 - 145 mEq/L   Potassium 3.8 3.5 - 5.1 mEq/L   Chloride 104 96 - 112 mEq/L   CO2 26 19 - 32 mEq/L   Glucose, Bld 92 70 - 99 mg/dL   BUN 11 6 - 23 mg/dL   Creatinine, Ser 8.93 0.40 - 1.50 mg/dL   Total Bilirubin 0.4 0.2 - 1.2 mg/dL   Alkaline Phosphatase 57 39 - 117 U/L   AST 23 0 - 37 U/L   ALT 27 0 - 53 U/L   Total Protein 7.0 6.0 - 8.3 g/dL   Albumin 4.2 3.5 - 5.2 g/dL   GFR 16.35 >39.99 mL/min   Calcium 8.9 8.4 - 10.5 mg/dL  Lipid panel   Collection Time: 05/05/24  7:43 AM  Result Value Ref Range   Cholesterol 163 0 - 200 mg/dL   Triglycerides 26.9 0.0 - 149.0 mg/dL   HDL 65.79 (L) >60.99 mg/dL   VLDL 85.3 0.0 - 59.9 mg/dL   LDL Cholesterol 884 (H) 0 - 99 mg/dL   Total CHOL/HDL Ratio 5    NonHDL 129.25     Assessment and Plan The patient's preventative maintenance and recommended screening tests for an annual wellness exam were reviewed in full today. Brought up to date unless services declined.  Counselled on the importance of diet, exercise, and its role in overall health and mortality. The patient's FH and SH was reviewed, including their home life, tobacco status, and drug and alcohol status.    Vaccines: Given flu shot Prostate Cancer Screen: no family history of prostate cancer Colon Cancer Screen:  iFOB yearly. Negative 2024 , DUE.. will plan cologuard      Smoking Status:none ETOH/ drug use: none/none  Hep C: done  HIV screen:   done in past, no new  partners  Routine general medical examination at a health care facility  Need for influenza vaccination -     Flu vaccine trivalent PF, 6mos and older(Flulaval,Afluria,Fluarix,Fluzone)  Hyperlipidemia, unspecified hyperlipidemia type Assessment & Plan: Chronic,stable  Encouraged exercise, weight loss, healthy eating habits. 2.1% 10-year ASCVD risk score.    Colon cancer screening -     Cologuard    Return in about 1 year (around 05/12/2025) for annual physical with fasting labs prior.   Greig Ring, MD

## 2024-05-12 NOTE — Assessment & Plan Note (Signed)
 Chronic,stable  Encouraged exercise, weight loss, healthy eating habits. 2.1% 10-year ASCVD risk score.

## 2024-05-19 DIAGNOSIS — Z1211 Encounter for screening for malignant neoplasm of colon: Secondary | ICD-10-CM | POA: Diagnosis not present

## 2024-05-30 LAB — COLOGUARD: COLOGUARD: NEGATIVE

## 2024-06-02 ENCOUNTER — Ambulatory Visit: Payer: Self-pay | Admitting: Family Medicine

## 2024-06-24 ENCOUNTER — Encounter: Payer: Self-pay | Admitting: Emergency Medicine

## 2024-06-24 ENCOUNTER — Ambulatory Visit
Admission: EM | Admit: 2024-06-24 | Discharge: 2024-06-24 | Disposition: A | Discharge status: Home / Self Care | Attending: Emergency Medicine | Admitting: Emergency Medicine

## 2024-06-24 DIAGNOSIS — J069 Acute upper respiratory infection, unspecified: Secondary | ICD-10-CM | POA: Diagnosis not present

## 2024-06-24 MED ORDER — PSEUDOEPH-BROMPHEN-DM 30-2-10 MG/5ML PO SYRP: 5.0000 mL | ORAL_SOLUTION | Freq: Four times a day (QID) | ORAL | 0 refills | Status: DC | PRN

## 2024-06-24 MED ORDER — PSEUDOEPH-BROMPHEN-DM 30-2-10 MG/5ML PO SYRP: 5.0000 mL | ORAL_SOLUTION | Freq: Four times a day (QID) | ORAL | 0 refills | Status: AC | PRN

## 2024-06-24 NOTE — Discharge Instructions (Signed)
 Take the Bromfed-DM as directed.  Take Tylenol as needed for discomfort or fever.  Follow-up with your primary care provider if your symptoms are not improving.

## 2024-06-24 NOTE — ED Provider Notes (Addendum)
 Brian Cox    CSN: [id number] Arrival date & time: 06/24/24  1417      History   Chief Complaint No chief complaint on file.   HPI Brian Cox is a 48 y.o. male.  Patient presents with 1 day history of bodyaches, sore throat, congestion, cough.  No fever, shortness of breath, vomiting, diarrhea.  No OTC medication taken today; took ibuprofen last night.  The history is provided by the patient and medical records.    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 Authorizing Provider  Ascorbic Acid (VITAMIN C) 500 MG CAPS Take 500 mg by mouth daily at 12 noon.    [provider]  brompheniramine-pseudoephedrine-DM 30-2-10 MG/5ML syrup Take 5 mLs by mouth 4 (four) times daily as needed. 06/24/24   Corlis Burnard DEL, NP  Cholecalciferol (VITAMIN D3) 50 MCG (2000 UT) CAPS Take 2,000 Units by mouth every other day.    [provider]  fluticasone (FLONASE) 50 MCG/ACT nasal spray Place 2 sprays into both nostrils daily at 12 noon.    [provider]  Zinc 30 MG CAPS Take 30 mg by mouth daily at 12 noon.    [provider]    Family History Family History  Problem Relation Age of Onset   Hyperlipidemia Mother    Hypertension Father    Arthritis Father    Diabetes Father    Aneurysm Maternal Grandmother    Stroke Maternal Grandmother    Heart disease Maternal Grandfather    Miscarriages / Stillbirths Sister    Cancer Paternal Grandmother        unknown, blood?   Heart attack Paternal Grandfather 62    Social History Social  History   Tobacco Use   Smoking status: Never    Passive exposure: Past   Smokeless tobacco: Never  Vaping Use   Vaping status: Never Used  Substance Use Topics   Alcohol use: No   Drug use: No     Allergies   Ciprofloxacin hcl, Donnatal  [phenobarbital-belladonna alk], and Levofloxacin   Review of Systems Review of Systems  Constitutional:  Negative for chills and fever.  HENT:  Positive for congestion and sore throat. Negative for ear pain.   Respiratory:  Positive for cough. Negative for shortness of breath.   Gastrointestinal:  Negative for diarrhea and vomiting.     Physical Exam Triage Vital Signs ED Triage Vitals [06/24/24 1430]  Encounter Vitals Group     BP 133/81     Girls Systolic BP Percentile      Girls Diastolic BP Percentile      Boys Systolic BP Percentile      Boys Diastolic BP Percentile      Pulse Rate 96     Resp 18     Temp 98.6 F (37 C)     Temp src      SpO2 98 %     Weight      Height      Head Circumference  Peak Flow      Pain Score      Pain Loc      Pain Education      Exclude from Growth Chart    No data found.  Updated Vital Signs BP 133/81   Pulse 96   Temp 98.6 F (37 C)   Resp 18   SpO2 98%   Visual Acuity Right Eye Distance:   Left Eye Distance:   Bilateral Distance:    Right Eye Near:   Left Eye Near:    Bilateral Near:     Physical Exam Constitutional:      General: He is not in acute distress. HENT:     Right Ear: Tympanic membrane normal.     Left Ear: Tympanic membrane normal.     Nose: Nose normal.     Mouth/Throat:     Mouth: Mucous membranes are moist.     Pharynx: Oropharynx is clear.     Comments: Clear PND Cardiovascular:     Rate and Rhythm: Normal rate and regular rhythm.     Heart sounds: Normal heart sounds.  Pulmonary:     Effort: Pulmonary effort is normal. No respiratory distress.     Breath sounds: Normal breath sounds.  Neurological:     Mental Status: He is alert.       UC Treatments / Results  Labs (all labs ordered are listed, but only abnormal results are displayed) Labs Reviewed - No data to display  EKG   Radiology No results found.  Procedures Procedures (including critical care time)  Medications Ordered in UC Medications - No data to display  Initial Impression / Assessment and Plan / UC Course  I have reviewed the triage vital signs and the nursing notes.  Pertinent labs & imaging results that were available during my care of the patient were reviewed by me and considered in my medical decision making (see chart for details).   Viral URI.  Afebrile and vital signs are stable.  Lungs are clear and O2 sat is 98% on room air.  Patient has been symptomatic for 1 day.  He declines strep, COVID, flu testing at this time.  Treating today with Bromfed-DM.  Tylenol as needed.  Education provided on viral respiratory infection.  Instructed him to follow-up with his PCP if he is not improving.  He agrees to plan of care. Final Clinical Impressions(s) / UC Diagnoses   Final diagnoses:  Viral URI     Discharge Instructions      Take the Bromfed-DM as directed.  Take Tylenol as needed for discomfort or fever.  Follow-up with your primary care provider if your symptoms are not improving.     ED Prescriptions     Medication Sig Dispense Auth. Provider   brompheniramine-pseudoephedrine-DM 30-2-10 MG/5ML syrup  (Status: Discontinued) Take 5 mLs by mouth 4 (four) times daily as needed. 120 mL Corlis Burnard DEL, NP   brompheniramine-pseudoephedrine-DM 30-2-10 MG/5ML syrup Take 5 mLs by mouth 4 (four) times daily as needed. 120 mL Corlis Burnard DEL, NP      PDMP not reviewed this encounter.   Corlis Burnard DEL, NP 06/24/24 1500    Corlis Burnard DEL, NP 06/24/24 [phone]

## 2025-05-06 ENCOUNTER — Other Ambulatory Visit

## 2025-05-13 ENCOUNTER — Encounter: Admitting: Family Medicine
